# Patient Record
Sex: Female | Born: 1985 | Race: Black or African American | Hispanic: No | Marital: Single | Smoking: Former smoker
Health system: Southern US, Community
[De-identification: ages and names within clinical notes are randomized; demographics above are authoritative.]

## PROBLEM LIST (undated history)

## (undated) ENCOUNTER — Inpatient Hospital Stay (HOSPITAL_COMMUNITY): Payer: Self-pay

## (undated) DIAGNOSIS — Z3480 Encounter for supervision of other normal pregnancy, unspecified trimester: Secondary | ICD-10-CM

## (undated) DIAGNOSIS — Z789 Other specified health status: Secondary | ICD-10-CM

## (undated) DIAGNOSIS — R51 Headache: Secondary | ICD-10-CM

## (undated) DIAGNOSIS — Z3400 Encounter for supervision of normal first pregnancy, unspecified trimester: Secondary | ICD-10-CM

## (undated) HISTORY — PX: I & D EXTREMITY: SHX5045

## (undated) HISTORY — PX: NO PAST SURGERIES: SHX2092

## (undated) HISTORY — DX: Headache: R51

---
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
## 2007-10-24 ENCOUNTER — Emergency Department (HOSPITAL_COMMUNITY): Admission: EM | Admit: 2007-10-24 | Discharge: 2007-10-24 | Payer: Self-pay | Admitting: Emergency Medicine

## 2008-09-22 ENCOUNTER — Emergency Department (HOSPITAL_COMMUNITY): Admission: EM | Admit: 2008-09-22 | Discharge: 2008-09-22 | Payer: Self-pay | Admitting: Emergency Medicine

## 2009-01-26 ENCOUNTER — Emergency Department (HOSPITAL_COMMUNITY): Admission: EM | Admit: 2009-01-26 | Discharge: 2009-01-26 | Payer: Self-pay | Admitting: Emergency Medicine

## 2009-01-28 ENCOUNTER — Emergency Department (HOSPITAL_COMMUNITY): Admission: EM | Admit: 2009-01-28 | Discharge: 2009-01-29 | Payer: Self-pay | Admitting: Emergency Medicine

## 2010-03-23 LAB — CSF CELL COUNT WITH DIFFERENTIAL
RBC Count, CSF: 1 /mm3 — ABNORMAL HIGH
Tube #: 3

## 2010-03-23 LAB — CSF CULTURE W GRAM STAIN: Culture: NO GROWTH

## 2010-04-10 LAB — DIFFERENTIAL
Basophils Absolute: 0.1 10*3/uL (ref 0.0–0.1)
Eosinophils Absolute: 0 10*3/uL (ref 0.0–0.7)
Eosinophils Relative: 1 % (ref 0–5)
Lymphocytes Relative: 14 % (ref 12–46)
Monocytes Absolute: 0.6 10*3/uL (ref 0.1–1.0)

## 2010-04-10 LAB — URINALYSIS, ROUTINE W REFLEX MICROSCOPIC
Bilirubin Urine: NEGATIVE
Nitrite: NEGATIVE
Specific Gravity, Urine: 1.021 (ref 1.005–1.030)
pH: 6.5 (ref 5.0–8.0)

## 2010-04-10 LAB — CBC
Hemoglobin: 12.1 g/dL (ref 12.0–15.0)
MCHC: 33.1 g/dL (ref 30.0–36.0)
MCV: 87.2 fL (ref 78.0–100.0)
RBC: 4.18 MIL/uL (ref 3.87–5.11)

## 2010-04-10 LAB — POCT I-STAT, CHEM 8
Chloride: 105 mEq/L (ref 96–112)
Creatinine, Ser: 0.7 mg/dL (ref 0.4–1.2)
Hemoglobin: 13.6 g/dL (ref 12.0–15.0)
Potassium: 3.6 mEq/L (ref 3.5–5.1)
Sodium: 138 mEq/L (ref 135–145)

## 2010-04-10 LAB — RPR: RPR Ser Ql: NONREACTIVE

## 2010-04-10 LAB — URINE MICROSCOPIC-ADD ON

## 2010-05-25 ENCOUNTER — Inpatient Hospital Stay (HOSPITAL_COMMUNITY)
Admission: AD | Admit: 2010-05-25 | Discharge: 2010-05-25 | Disposition: A | Discharge status: Home / Self Care | Payer: BC Managed Care – PPO | Source: Ambulatory Visit | Attending: Obstetrics and Gynecology | Admitting: Obstetrics and Gynecology

## 2010-05-25 DIAGNOSIS — N949 Unspecified condition associated with female genital organs and menstrual cycle: Secondary | ICD-10-CM

## 2010-05-25 DIAGNOSIS — N938 Other specified abnormal uterine and vaginal bleeding: Secondary | ICD-10-CM | POA: Insufficient documentation

## 2010-05-25 LAB — WET PREP, GENITAL: Yeast Wet Prep HPF POC: NONE SEEN

## 2010-05-26 LAB — GC/CHLAMYDIA PROBE AMP, GENITAL: Chlamydia, DNA Probe: NEGATIVE

## 2010-07-15 ENCOUNTER — Emergency Department (HOSPITAL_COMMUNITY)
Admission: EM | Admit: 2010-07-15 | Discharge: 2010-07-16 | Disposition: A | Discharge status: Home / Self Care | Payer: BC Managed Care – PPO | Attending: Emergency Medicine | Admitting: Emergency Medicine

## 2010-07-15 DIAGNOSIS — R109 Unspecified abdominal pain: Secondary | ICD-10-CM | POA: Insufficient documentation

## 2010-07-15 DIAGNOSIS — O210 Mild hyperemesis gravidarum: Secondary | ICD-10-CM | POA: Insufficient documentation

## 2010-07-15 DIAGNOSIS — O99891 Other specified diseases and conditions complicating pregnancy: Secondary | ICD-10-CM | POA: Insufficient documentation

## 2010-07-16 ENCOUNTER — Emergency Department (HOSPITAL_COMMUNITY): Payer: BC Managed Care – PPO

## 2010-07-16 LAB — WET PREP, GENITAL
Trich, Wet Prep: NONE SEEN
Yeast Wet Prep HPF POC: NONE SEEN

## 2010-07-16 LAB — POCT I-STAT, CHEM 8
BUN: 12 mg/dL (ref 6–23)
Calcium, Ion: 1.2 mmol/L (ref 1.12–1.32)
Chloride: 103 mEq/L (ref 96–112)
Creatinine, Ser: 0.8 mg/dL (ref 0.50–1.10)
Glucose, Bld: 99 mg/dL (ref 70–99)
HCT: 41 % (ref 36.0–46.0)
Hemoglobin: 13.9 g/dL (ref 12.0–15.0)
Potassium: 4.1 mEq/L (ref 3.5–5.1)
Sodium: 140 mEq/L (ref 135–145)
TCO2: 25 mmol/L (ref 0–100)

## 2010-07-16 LAB — POCT PREGNANCY, URINE: Preg Test, Ur: POSITIVE

## 2010-07-16 LAB — HCG, QUANTITATIVE, PREGNANCY: hCG, Beta Chain, Quant, S: 41 m[IU]/mL — ABNORMAL HIGH (ref <5)

## 2010-07-16 LAB — URINALYSIS, ROUTINE W REFLEX MICROSCOPIC
Bilirubin Urine: NEGATIVE
Glucose, UA: NEGATIVE mg/dL
Hgb urine dipstick: NEGATIVE
Ketones, ur: NEGATIVE mg/dL
Leukocytes, UA: NEGATIVE
Nitrite: NEGATIVE
Protein, ur: NEGATIVE mg/dL
Specific Gravity, Urine: 1.026 (ref 1.005–1.030)
Urobilinogen, UA: 1 mg/dL (ref 0.0–1.0)
pH: 7 (ref 5.0–8.0)

## 2010-07-18 ENCOUNTER — Encounter (HOSPITAL_COMMUNITY): Payer: Self-pay

## 2010-07-18 ENCOUNTER — Inpatient Hospital Stay (HOSPITAL_COMMUNITY)
Admission: AD | Admit: 2010-07-18 | Discharge: 2010-07-18 | Disposition: A | Discharge status: Home / Self Care | Payer: BC Managed Care – PPO | Source: Ambulatory Visit | Attending: Family Medicine | Admitting: Family Medicine

## 2010-07-18 DIAGNOSIS — O2691 Pregnancy related conditions, unspecified, first trimester: Secondary | ICD-10-CM

## 2010-07-18 DIAGNOSIS — Z0189 Encounter for other specified special examinations: Secondary | ICD-10-CM

## 2010-07-18 DIAGNOSIS — O2690 Pregnancy related conditions, unspecified, unspecified trimester: Secondary | ICD-10-CM

## 2010-07-18 DIAGNOSIS — O209 Hemorrhage in early pregnancy, unspecified: Secondary | ICD-10-CM | POA: Insufficient documentation

## 2010-07-18 HISTORY — DX: Other specified health status: Z78.9

## 2010-07-18 NOTE — ED Provider Notes (Addendum)
Patient returns today for follow up Bhcg. She was evaluated on 7/14 and her Bhcg was 41, today it has increased to 152. She denies pain or bleeding. She will return in 2 days for follow up. Ectopic precautions given.   [place], [place] 07/18/10 1626

## 2010-07-18 NOTE — Progress Notes (Signed)
No bleeding no pain here for BHCG

## 2010-07-20 ENCOUNTER — Inpatient Hospital Stay (HOSPITAL_COMMUNITY)
Admission: AD | Admit: 2010-07-20 | Discharge: 2010-07-20 | Disposition: A | Discharge status: Home / Self Care | Payer: BC Managed Care – PPO | Source: Ambulatory Visit | Attending: Obstetrics & Gynecology | Admitting: Obstetrics & Gynecology

## 2010-07-20 ENCOUNTER — Inpatient Hospital Stay (HOSPITAL_COMMUNITY): Admit: 2010-07-20 | Payer: Self-pay

## 2010-07-20 ENCOUNTER — Encounter (HOSPITAL_COMMUNITY): Payer: Self-pay

## 2010-07-20 DIAGNOSIS — O3680X0 Pregnancy with inconclusive fetal viability, not applicable or unspecified: Secondary | ICD-10-CM

## 2010-07-20 DIAGNOSIS — O280 Abnormal hematological finding on antenatal screening of mother: Secondary | ICD-10-CM

## 2010-07-20 DIAGNOSIS — O209 Hemorrhage in early pregnancy, unspecified: Secondary | ICD-10-CM | POA: Insufficient documentation

## 2010-07-20 LAB — HCG, QUANTITATIVE, PREGNANCY: hCG, Beta Chain, Quant, S: 363 m[IU]/mL — ABNORMAL HIGH (ref <5)

## 2010-07-20 NOTE — ED Provider Notes (Addendum)
History     Chief Complaint  Patient presents with  . Follow-up   The history is provided by the patient.   Patient returns today for follow up Bhcg on her first visit 7/12 her Bhcg was 41 and her ultrasound showed no IUP.She returned on 7/14 and the numbers increased to 152.  She denies bleeding or pain today.  OB History    Grav Para Term Preterm Abortions TAB SAB Ect Mult Living   1               Past Medical History  Diagnosis Date  . No pertinent past medical history     No past surgical history on file.  No family history on file.  History  Substance Use Topics  . Smoking status: Not on file  . Smokeless tobacco: Not on file  . Alcohol Use:     Allergies: No Known Allergies  No prescriptions prior to admission    Review of Systems  Constitutional: Negative for fever and chills.  Genitourinary:       No bleeding or pain   Physical Exam   Blood pressure 128/72, pulse 100, temperature 98.7 F (37.1 C), temperature source Oral, resp. rate 16, height 5\' 7"  (1.702 m), weight 174 lb (78.926 kg), last menstrual period 05/23/2010, SpO2 99.00%.  Physical Exam  Nursing note and vitals reviewed. Constitutional: She appears well-developed and well-nourished.    MAU Course  Procedures  MDM Bhcg today is 363. Since pt. Has no pain or bleeding we will have her return in one week for f/u ultrasound. She will continue ectopic precautions and will return sooner for problems.

## 2010-07-20 NOTE — Progress Notes (Signed)
Pt to MAU for repeat BHCG. Pt denies pain or bleeding.

## 2010-07-27 ENCOUNTER — Encounter (HOSPITAL_COMMUNITY): Payer: Self-pay | Admitting: Physician Assistant

## 2010-07-27 ENCOUNTER — Other Ambulatory Visit (HOSPITAL_COMMUNITY): Payer: Self-pay | Admitting: Obstetrics & Gynecology

## 2010-07-27 ENCOUNTER — Ambulatory Visit (HOSPITAL_COMMUNITY)
Admit: 2010-07-27 | Discharge: 2010-07-27 | Disposition: A | Discharge status: Home / Self Care | Payer: BC Managed Care – PPO | Attending: Obstetrics & Gynecology | Admitting: Obstetrics & Gynecology

## 2010-07-27 ENCOUNTER — Inpatient Hospital Stay (HOSPITAL_COMMUNITY)
Admission: AD | Admit: 2010-07-27 | Discharge: 2010-07-27 | Disposition: A | Discharge status: Home / Self Care | Payer: Medicaid Other | Source: Ambulatory Visit | Attending: Obstetrics & Gynecology | Admitting: Obstetrics & Gynecology

## 2010-07-27 DIAGNOSIS — O3680X0 Pregnancy with inconclusive fetal viability, not applicable or unspecified: Secondary | ICD-10-CM

## 2010-07-27 DIAGNOSIS — Z3689 Encounter for other specified antenatal screening: Secondary | ICD-10-CM | POA: Insufficient documentation

## 2010-07-27 DIAGNOSIS — Z363 Encounter for antenatal screening for malformations: Secondary | ICD-10-CM | POA: Insufficient documentation

## 2010-07-27 DIAGNOSIS — Z1389 Encounter for screening for other disorder: Secondary | ICD-10-CM | POA: Insufficient documentation

## 2010-07-27 DIAGNOSIS — Z3201 Encounter for pregnancy test, result positive: Secondary | ICD-10-CM | POA: Insufficient documentation

## 2010-07-27 DIAGNOSIS — Z3490 Encounter for supervision of normal pregnancy, unspecified, unspecified trimester: Secondary | ICD-10-CM

## 2010-07-27 NOTE — Progress Notes (Signed)
Pt here for bhcg only, denies pain or bleeding.

## 2010-07-27 NOTE — ED Provider Notes (Signed)
History   Chief Complaint:  Labs Only   Dawn Velez is  25 y.o. G1P0 Patient's last menstrual period was 05/23/2010.Marland Kitchen  Her pregnancy status is positive.  She is [redacted]w[redacted]d by ultrasound today She presents for Labs Only . Pt presents for FU quant and ultrasound today. No complaints    Past Medical History  Diagnosis Date  . No pertinent past medical history     No past surgical history on file.  No family history on file.  History  Substance Use Topics  . Smoking status: Not on file  . Smokeless tobacco: Not on file  . Alcohol Use:     Allergies: No Known Allergies  No prescriptions prior to admission    Review of Systems - History obtained from the patient Negative  Physical Exam   Blood pressure 112/71, pulse 73, temperature 98.9 F (37.2 C), temperature source Oral, resp. rate 16, height 5\' 7"  (1.702 m), weight 174 lb (78.926 kg), last menstrual period 05/23/2010.  General: General appearance - alert, well appearing, and in no distress and oriented to person, place, and time Mental status - alert, oriented to person, place, and time, normal mood, behavior, speech, dress, motor activity, and thought processes, affect appropriate to mood Focused Gynecological Exam: examination not indicated  Labs: Recent Results (from the past 24 hour(s))  HCG, QUANTITATIVE, PREGNANCY   Collection Time   07/27/10 10:55 AM      Component Value Range   hCG, Beta Chain, Quant, S 4375 (*) <5 (mIU/mL)    Ultrasound Studies: IUP [redacted]w[redacted]d with + yolk sac   Assessment: Pregnancy IUP confirmed  Plan: FU with OB/Gyn provider of your choice to begin prenatal care  Discharge Medications: Rec: OTC PNV 1 po daily    Izaiyah Kleinman E. 07/27/2010, 12:13 PM

## 2010-09-01 LAB — HEPATITIS B SURFACE ANTIGEN: Hepatitis B Surface Ag: NEGATIVE

## 2010-09-01 LAB — RPR: RPR: NONREACTIVE

## 2010-09-01 LAB — HIV ANTIBODY (ROUTINE TESTING W REFLEX): HIV: NONREACTIVE

## 2010-10-06 ENCOUNTER — Inpatient Hospital Stay (HOSPITAL_COMMUNITY)
Admission: AD | Admit: 2010-10-06 | Discharge: 2010-10-06 | Disposition: A | Discharge status: Home / Self Care | Payer: BC Managed Care – PPO | Source: Ambulatory Visit | Attending: Obstetrics and Gynecology | Admitting: Obstetrics and Gynecology

## 2010-10-06 DIAGNOSIS — O99891 Other specified diseases and conditions complicating pregnancy: Secondary | ICD-10-CM | POA: Insufficient documentation

## 2010-10-06 DIAGNOSIS — Z331 Pregnant state, incidental: Secondary | ICD-10-CM

## 2010-10-06 DIAGNOSIS — G43909 Migraine, unspecified, not intractable, without status migrainosus: Secondary | ICD-10-CM | POA: Insufficient documentation

## 2010-10-06 LAB — URINE MICROSCOPIC-ADD ON

## 2010-10-06 LAB — URINALYSIS, ROUTINE W REFLEX MICROSCOPIC
Glucose, UA: NEGATIVE
Protein, ur: 100 — AB
Specific Gravity, Urine: 1.024
Urobilinogen, UA: 1

## 2010-10-06 MED ORDER — DIPHENHYDRAMINE HCL 50 MG/ML IJ SOLN
25.0000 mg | Freq: Once | INTRAMUSCULAR | Status: AC
  Administered 2010-10-06: 25 mg via INTRAVENOUS
  Filled 2010-10-06: qty 1

## 2010-10-06 MED ORDER — PROMETHAZINE HCL 25 MG/ML IJ SOLN
12.5000 mg | Freq: Once | INTRAVENOUS | Status: AC
  Administered 2010-10-06: 12.5 mg via INTRAVENOUS
  Filled 2010-10-06: qty 0.5

## 2010-10-06 MED ORDER — DEXAMETHASONE SODIUM PHOSPHATE 10 MG/ML IJ SOLN
10.0000 mg | Freq: Once | INTRAMUSCULAR | Status: AC
  Administered 2010-10-06: 10 mg via INTRAVENOUS
  Filled 2010-10-06: qty 1

## 2010-10-06 NOTE — Progress Notes (Signed)
"  I have had a H/A for 3 days now.  I called Dr. Ebony Hail office today and they scheduled me an appointment for tomorrow morning, but I couldn't wait that long.  Light & noise makes it worse."

## 2010-10-06 NOTE — Progress Notes (Signed)
Pt states she has had a headache for 3 days. Has taken tylenol without relief.

## 2010-10-06 NOTE — ED Provider Notes (Signed)
Chief Complaint:  Headache   Dawn Velez is  25 y.o. G1P0.  Patient's last menstrual period was 05/23/2010. [redacted]w[redacted]d    She presents complaining of Headache Chronic migraines, unable to take meds due to pregnancy. Onset is described as gradual and has been present for  3 days. + Nausea, phono/photo phobia  Obstetrical/Gynecological History: OB History    Grav Para Term Preterm Abortions TAB SAB Ect Mult Living   1               Past Medical History: Past Medical History  Diagnosis Date  . No pertinent past medical history     Past Surgical History: No past surgical history on file.  Family History: No family history on file.  Social History: History  Substance Use Topics  . Smoking status: Not on file  . Smokeless tobacco: Not on file  . Alcohol Use:     Allergies: No Known Allergies  Prescriptions prior to admission  Medication Sig Dispense Refill  . acetaminophen (TYLENOL) 500 MG tablet Take 1,000 mg by mouth every 6 (six) hours as needed.          Review of Systems - Negative except what has been reviewed in the HPI  Physical Exam   Blood pressure 134/79, pulse 78, temperature 98.5 F (36.9 C), temperature source Oral, resp. rate 16, last menstrual period 05/23/2010, SpO2 100.00%.  General: General appearance - oriented to person, place, and time and uncomfortable appearing, head under covers, lights in room off Mental status - normal mood, behavior, speech, dress, motor activity, and thought processes, affect appropriate to mood Eyes - pupils equal and reactive, extraocular eye movements intact, light sensitivity Nose - normal and patent, no erythema, discharge or polyps Mouth - mucous membranes moist, pharynx normal without lesions Neck - supple, no significant adenopathy Lymphatics - no palpable lymphadenopathy, no hepatosplenomegaly Neurological - screening mental status exam normal, neck supple without rigidity, cranial nerves II through XII  intact, DTR's normal and symmetric, motor and sensory grossly normal bilaterally Musculoskeletal - no joint tenderness, deformity or swelling, no muscular tenderness noted Focused Gynecological Exam: examination not indicated FHR: 148  MD Consult: Discussed with Dr. Ambrose Mantle, agrees with plan for IVF, Dexamethasone, benadryl, and Phenergan IV  ED Course: IVF and meds per HA protocol Pt rating pain 0/10 after meds, sitting in room with lights on eating pizza  Assessment: Migraine HA Pregnancy  Plan: Discharge home FU as scheduled in the office tomorrow  Sophie Quiles E. 10/06/2010,8:02 PM

## 2011-01-05 NOTE — L&D Delivery Note (Signed)
Delivery Note At 5:25 PM a viable female was delivered via Vaginal, Spontaneous Delivery (Presentation: Right Occiput Anterior).  APGAR: 8, 9; weight 7 lb 13.4 oz (3555 g).   Placenta status: Intact, Spontaneous.  Cord: 3 vessels with the following complications: None.   Anesthesia: Epidural  Episiotomy: N/a\a Lacerations: 2nd degree;Perineal Suture Repair: 3.0 vicryl rapide Est. Blood Loss (mL): 500 Mom to postpartum.  Baby to nursery-stable.  BOVARD,Peretz Thieme 03/30/2011, 5:51 PM  B+/RI/ Br/POPs

## 2011-02-23 LAB — STREP B DNA PROBE: GBS: POSITIVE

## 2011-03-28 ENCOUNTER — Encounter (HOSPITAL_COMMUNITY): Payer: Self-pay

## 2011-03-28 ENCOUNTER — Inpatient Hospital Stay (HOSPITAL_COMMUNITY)
Admission: AD | Admit: 2011-03-28 | Discharge: 2011-03-28 | Disposition: A | Discharge status: Home / Self Care | Payer: BC Managed Care – PPO | Source: Ambulatory Visit | Attending: Obstetrics and Gynecology | Admitting: Obstetrics and Gynecology

## 2011-03-28 DIAGNOSIS — O479 False labor, unspecified: Secondary | ICD-10-CM | POA: Insufficient documentation

## 2011-03-28 MED ORDER — MORPHINE SULFATE 10 MG/ML IJ SOLN
5.0000 mg | Freq: Once | INTRAMUSCULAR | Status: AC
  Administered 2011-03-28: 5 mg via INTRAMUSCULAR
  Filled 2011-03-28: qty 1

## 2011-03-28 NOTE — MAU Note (Signed)
Contractions since around 4:00 with some vaginal bleeding, contractions every 5 to 7 minutes, 40 minutes.

## 2011-03-28 NOTE — Discharge Instructions (Signed)

## 2011-03-29 ENCOUNTER — Inpatient Hospital Stay (HOSPITAL_COMMUNITY)
Admission: AD | Admit: 2011-03-29 | Discharge: 2011-04-01 | DRG: 373 | Disposition: A | Discharge status: Home / Self Care | Payer: BC Managed Care – PPO | Attending: Obstetrics and Gynecology | Admitting: Obstetrics and Gynecology

## 2011-03-29 ENCOUNTER — Encounter (HOSPITAL_COMMUNITY): Payer: Self-pay | Admitting: *Deleted

## 2011-03-29 ENCOUNTER — Encounter (HOSPITAL_COMMUNITY): Payer: Self-pay

## 2011-03-29 ENCOUNTER — Telehealth (HOSPITAL_COMMUNITY): Payer: Self-pay | Admitting: *Deleted

## 2011-03-29 DIAGNOSIS — O480 Post-term pregnancy: Principal | ICD-10-CM | POA: Diagnosis present

## 2011-03-29 DIAGNOSIS — Z3400 Encounter for supervision of normal first pregnancy, unspecified trimester: Secondary | ICD-10-CM

## 2011-03-29 HISTORY — DX: Encounter for supervision of normal first pregnancy, unspecified trimester: Z34.00

## 2011-03-29 NOTE — Telephone Encounter (Signed)
Preadmission screen  

## 2011-03-29 NOTE — MAU Note (Signed)
Pt presents to mau for labor check.  Was 1cm yesterday.

## 2011-03-29 NOTE — H&P (Signed)
Dawn Velez is a 26 y.o. female G1P0 at 49+ for iol.  Pt has had irregular contractions for several days.  Relatively uncomlicated PNC, +FM, no LOF, no VB, occ ctx;   Maternal Medical History:  Contractions: Onset was more than 2 days ago.   Frequency: irregular.    Fetal activity: Perceived fetal activity is normal.      OB History    Grav Para Term Preterm Abortions TAB SAB Ect Mult Living   1 0 0 0 0 0 0 0 0 0     G1 present; no abn pap, no STDs  Past Medical History  Diagnosis Date  . No pertinent past medical history   . Headache   . Normal pregnancy, first 03/29/2011   Past Surgical History  Procedure Date  . No past surgeries   . I&d extremity     boil on back   Family History: family history includes Alcohol abuse in her paternal uncle; Drug abuse in her paternal grandfather; Heart disease in her paternal grandmother; Hypertension in her mother, paternal grandfather, and sister; Mental illness in her brother; and Mental retardation in her brother.  There is no history of Anesthesia problems. Social History:  does not have a smoking history on file. She does not have any smokeless tobacco history on file. She reports that she does not drink alcohol or use illicit drugs.single Meds PNV All NKDA  Review of Systems  Constitutional: Negative.   HENT: Negative.   Eyes: Negative.   Respiratory: Negative.   Cardiovascular: Negative.   Gastrointestinal: Negative.   Genitourinary: Negative.   Musculoskeletal: Negative.   Skin: Negative.   Neurological: Negative.   Psychiatric/Behavioral: Negative.       Last menstrual period 05/23/2010. Maternal Exam:  Abdomen: Fundal height is appropriate for gestation.   Estimated fetal weight is 7#.   Fetal presentation: vertex     Physical Exam  Constitutional: She is oriented to person, place, and time. She appears well-developed and well-nourished.  HENT:  Head: Normocephalic.  Neck: Normal range of motion. Neck  supple. No thyromegaly present.  Cardiovascular: Normal rate and regular rhythm.   Respiratory: Effort normal and breath sounds normal. No respiratory distress.  GI: Soft. Bowel sounds are normal. There is no tenderness.  Musculoskeletal: Normal range of motion.  Neurological: She is alert and oriented to person, place, and time.  Skin: Skin is warm.  Psychiatric: She has a normal mood and affect. Her behavior is normal.    Prenatal labs: ABO, Rh: B/Positive/-- (08/28 0000) Antibody: Negative (08/28 0000) Rubella: Immune (08/28 0000) RPR: Nonreactive (08/28 0000)  HBsAg: Negative (08/28 0000)  HIV: Non-reactive (08/28 0000)  GBS: Positive (02/19 0000)  Hgb 13.1/ Hgb electro WNL/Plt 295K/ GC neg/ Chl neg/ First Tri Screen WNL/ AFP WNL/ CF neg  [place] 9 wk cwd First Tri Screen WNL Anat scan - Lt CP cyst, ow nl anat, ant plac, female Assessment/Plan: 25yo G1P0 at 34+ for iol Given PCN for gbbs prophylaxis Pitocin and AROM to augment Epidural for comfort Expect SVD   BOVARD,Dawn Velez 03/29/2011, 10:59 PM

## 2011-03-29 NOTE — Progress Notes (Signed)
Dr. Ambrose Mantle notified of pt presenting for labor check. Notified of VE and ctx pattern. Notified of induction in am.  Orders to monitor for one hour and recheck cervix.

## 2011-03-30 ENCOUNTER — Inpatient Hospital Stay (HOSPITAL_COMMUNITY)
Admission: RE | Admit: 2011-03-30 | Discharge: 2011-03-30 | DRG: 382 | Disposition: A | Discharge status: Home / Self Care | Payer: BC Managed Care – PPO | Source: Ambulatory Visit | Attending: Obstetrics and Gynecology | Admitting: Obstetrics and Gynecology

## 2011-03-30 ENCOUNTER — Encounter (HOSPITAL_COMMUNITY): Payer: Self-pay | Admitting: Anesthesiology

## 2011-03-30 ENCOUNTER — Inpatient Hospital Stay (HOSPITAL_COMMUNITY): Payer: BC Managed Care – PPO | Admitting: Anesthesiology

## 2011-03-30 ENCOUNTER — Encounter (HOSPITAL_COMMUNITY): Payer: Self-pay | Admitting: *Deleted

## 2011-03-30 ENCOUNTER — Encounter (HOSPITAL_COMMUNITY): Payer: Self-pay | Admitting: Obstetrics and Gynecology

## 2011-03-30 DIAGNOSIS — O479 False labor, unspecified: Secondary | ICD-10-CM | POA: Diagnosis present

## 2011-03-30 DIAGNOSIS — Z3400 Encounter for supervision of normal first pregnancy, unspecified trimester: Secondary | ICD-10-CM

## 2011-03-30 HISTORY — DX: Encounter for supervision of normal first pregnancy, unspecified trimester: Z34.00

## 2011-03-30 LAB — CBC
Hemoglobin: 12.7 g/dL (ref 12.0–15.0)
MCH: 28.2 pg (ref 26.0–34.0)
RBC: 4.5 MIL/uL (ref 3.87–5.11)

## 2011-03-30 LAB — RPR: RPR Ser Ql: NONREACTIVE

## 2011-03-30 MED ORDER — ONDANSETRON HCL 4 MG/2ML IJ SOLN: 4.0000 mg | INTRAMUSCULAR | Status: DC | PRN

## 2011-03-30 MED ORDER — PENICILLIN G POTASSIUM 5000000 UNITS IJ SOLR
2.5000 10*6.[IU] | INTRAVENOUS | Status: DC
  Administered 2011-03-30 (×3): 2.5 10*6.[IU] via INTRAVENOUS
  Filled 2011-03-30 (×7): qty 2.5

## 2011-03-30 MED ORDER — SIMETHICONE 80 MG PO CHEW: 80.0000 mg | CHEWABLE_TABLET | ORAL | Status: DC | PRN

## 2011-03-30 MED ORDER — LACTATED RINGERS IV SOLN: 500.0000 mL | INTRAVENOUS | Status: DC | PRN

## 2011-03-30 MED ORDER — BENZOCAINE-MENTHOL 20-0.5 % EX AERO
1.0000 "application " | INHALATION_SPRAY | CUTANEOUS | Status: DC | PRN
  Administered 2011-03-30: 1 via TOPICAL

## 2011-03-30 MED ORDER — ONDANSETRON HCL 4 MG/2ML IJ SOLN: 4.0000 mg | Freq: Four times a day (QID) | INTRAMUSCULAR | Status: DC | PRN

## 2011-03-30 MED ORDER — FENTANYL 2.5 MCG/ML BUPIVACAINE 1/10 % EPIDURAL INFUSION (WH - ANES)
INTRAMUSCULAR | Status: DC | PRN
  Administered 2011-03-30: 15 mL/h via EPIDURAL

## 2011-03-30 MED ORDER — CITRIC ACID-SODIUM CITRATE 334-500 MG/5ML PO SOLN: 30.0000 mL | ORAL | Status: DC | PRN

## 2011-03-30 MED ORDER — LACTATED RINGERS IV SOLN
INTRAVENOUS | Status: DC
  Administered 2011-03-30 (×6): via INTRAVENOUS

## 2011-03-30 MED ORDER — SENNOSIDES-DOCUSATE SODIUM 8.6-50 MG PO TABS
2.0000 | ORAL_TABLET | Freq: Every day | ORAL | Status: DC
  Administered 2011-03-30 – 2011-03-31 (×2): 2 via ORAL

## 2011-03-30 MED ORDER — BUTORPHANOL TARTRATE 2 MG/ML IJ SOLN
1.0000 mg | Freq: Once | INTRAMUSCULAR | Status: AC
  Administered 2011-03-30: 1 mg via INTRAVENOUS
  Filled 2011-03-30: qty 1

## 2011-03-30 MED ORDER — OXYTOCIN 20 UNITS IN LACTATED RINGERS INFUSION - SIMPLE: 125.0000 mL/h | Freq: Once | INTRAVENOUS | Status: DC

## 2011-03-30 MED ORDER — PRENATAL MULTIVITAMIN CH
1.0000 | ORAL_TABLET | Freq: Every day | ORAL | Status: DC
  Administered 2011-03-31 – 2011-04-01 (×2): 1 via ORAL
  Filled 2011-03-30 (×3): qty 1

## 2011-03-30 MED ORDER — PHENYLEPHRINE 40 MCG/ML (10ML) SYRINGE FOR IV PUSH (FOR BLOOD PRESSURE SUPPORT)
80.0000 ug | PREFILLED_SYRINGE | INTRAVENOUS | Status: DC | PRN
  Filled 2011-03-30 (×2): qty 5

## 2011-03-30 MED ORDER — OXYCODONE-ACETAMINOPHEN 5-325 MG PO TABS: 1.0000 | ORAL_TABLET | ORAL | Status: DC | PRN

## 2011-03-30 MED ORDER — TERBUTALINE SULFATE 1 MG/ML IJ SOLN: 0.2500 mg | Freq: Once | INTRAMUSCULAR | Status: DC | PRN

## 2011-03-30 MED ORDER — ZOLPIDEM TARTRATE 5 MG PO TABS: 5.0000 mg | ORAL_TABLET | Freq: Every evening | ORAL | Status: DC | PRN

## 2011-03-30 MED ORDER — LACTATED RINGERS IV SOLN: 500.0000 mL | Freq: Once | INTRAVENOUS | Status: DC

## 2011-03-30 MED ORDER — IBUPROFEN 600 MG PO TABS
600.0000 mg | ORAL_TABLET | Freq: Four times a day (QID) | ORAL | Status: DC
  Administered 2011-03-31 – 2011-04-01 (×7): 600 mg via ORAL
  Filled 2011-03-30 (×7): qty 1

## 2011-03-30 MED ORDER — OXYTOCIN 20 UNITS IN LACTATED RINGERS INFUSION - SIMPLE
1.0000 m[IU]/min | INTRAVENOUS | Status: DC
  Administered 2011-03-30: 7 m[IU]/min via INTRAVENOUS
  Administered 2011-03-30: 1 m[IU]/min via INTRAVENOUS
  Administered 2011-03-30: 333 m[IU]/min via INTRAVENOUS
  Filled 2011-03-30 (×2): qty 1000

## 2011-03-30 MED ORDER — PENICILLIN G POTASSIUM 5000000 UNITS IJ SOLR
5.0000 10*6.[IU] | Freq: Once | INTRAVENOUS | Status: AC
  Administered 2011-03-30: 5 10*6.[IU] via INTRAVENOUS
  Filled 2011-03-30: qty 5

## 2011-03-30 MED ORDER — LIDOCAINE HCL (PF) 1 % IJ SOLN: 30.0000 mL | INTRAMUSCULAR | Status: DC | PRN

## 2011-03-30 MED ORDER — FLEET ENEMA 7-19 GM/118ML RE ENEM: 1.0000 | ENEMA | RECTAL | Status: DC | PRN

## 2011-03-30 MED ORDER — DIPHENHYDRAMINE HCL 25 MG PO CAPS: 25.0000 mg | ORAL_CAPSULE | Freq: Four times a day (QID) | ORAL | Status: DC | PRN

## 2011-03-30 MED ORDER — PRENATAL MULTIVITAMIN CH: 1.0000 | ORAL_TABLET | Freq: Every day | ORAL | Status: DC

## 2011-03-30 MED ORDER — DIPHENHYDRAMINE HCL 50 MG/ML IJ SOLN: 12.5000 mg | INTRAMUSCULAR | Status: DC | PRN

## 2011-03-30 MED ORDER — EPHEDRINE 5 MG/ML INJ
10.0000 mg | INTRAVENOUS | Status: DC | PRN
  Administered 2011-03-30: 10 mg via INTRAVENOUS

## 2011-03-30 MED ORDER — OXYTOCIN BOLUS FROM INFUSION
500.0000 mL | Freq: Once | INTRAVENOUS | Status: DC
Start: 2011-03-30 — End: 2011-03-30
  Filled 2011-03-30: qty 500

## 2011-03-30 MED ORDER — ACETAMINOPHEN 325 MG PO TABS: 650.0000 mg | ORAL_TABLET | ORAL | Status: DC | PRN

## 2011-03-30 MED ORDER — DIBUCAINE 1 % RE OINT: 1.0000 "application " | TOPICAL_OINTMENT | RECTAL | Status: DC | PRN

## 2011-03-30 MED ORDER — FENTANYL 2.5 MCG/ML BUPIVACAINE 1/10 % EPIDURAL INFUSION (WH - ANES)
14.0000 mL/h | INTRAMUSCULAR | Status: DC
  Administered 2011-03-30 (×2): 14 mL/h via EPIDURAL
  Filled 2011-03-30 (×3): qty 60

## 2011-03-30 MED ORDER — TETANUS-DIPHTH-ACELL PERTUSSIS 5-2.5-18.5 LF-MCG/0.5 IM SUSP
0.5000 mL | Freq: Once | INTRAMUSCULAR | Status: AC
  Administered 2011-03-31: 0.5 mL via INTRAMUSCULAR
  Filled 2011-03-30: qty 0.5

## 2011-03-30 MED ORDER — BUTORPHANOL TARTRATE 2 MG/ML IJ SOLN
2.0000 mg | INTRAMUSCULAR | Status: DC | PRN
  Administered 2011-03-30: 2 mg via INTRAVENOUS
  Filled 2011-03-30: qty 1

## 2011-03-30 MED ORDER — LIDOCAINE HCL (PF) 1 % IJ SOLN
INTRAMUSCULAR | Status: DC | PRN
  Administered 2011-03-30: 5 mL

## 2011-03-30 MED ORDER — BENZOCAINE-MENTHOL 20-0.5 % EX AERO
INHALATION_SPRAY | CUTANEOUS | Status: AC
  Filled 2011-03-30: qty 56

## 2011-03-30 MED ORDER — LACTATED RINGERS IV SOLN: INTRAVENOUS | Status: DC

## 2011-03-30 MED ORDER — SODIUM BICARBONATE 8.4 % IV SOLN
INTRAVENOUS | Status: DC | PRN
  Administered 2011-03-30: 4 mL via EPIDURAL

## 2011-03-30 MED ORDER — PHENYLEPHRINE 40 MCG/ML (10ML) SYRINGE FOR IV PUSH (FOR BLOOD PRESSURE SUPPORT): 80.0000 ug | PREFILLED_SYRINGE | INTRAVENOUS | Status: DC | PRN

## 2011-03-30 MED ORDER — WITCH HAZEL-GLYCERIN EX PADS: 1.0000 "application " | MEDICATED_PAD | CUTANEOUS | Status: DC | PRN

## 2011-03-30 MED ORDER — EPHEDRINE 5 MG/ML INJ
10.0000 mg | INTRAVENOUS | Status: DC | PRN
  Filled 2011-03-30 (×2): qty 4

## 2011-03-30 MED ORDER — ONDANSETRON HCL 4 MG PO TABS: 4.0000 mg | ORAL_TABLET | ORAL | Status: DC | PRN

## 2011-03-30 MED ORDER — LANOLIN HYDROUS EX OINT: TOPICAL_OINTMENT | CUTANEOUS | Status: DC | PRN

## 2011-03-30 MED ORDER — IBUPROFEN 600 MG PO TABS: 600.0000 mg | ORAL_TABLET | Freq: Four times a day (QID) | ORAL | Status: DC | PRN

## 2011-03-30 NOTE — Progress Notes (Signed)
Dawn Velez is a 26 y.o. G1P0000 at [redacted]w[redacted]d Admitted for induction of labor due to Post dates.  cam in overnight for ctx.    Due date 3/23.  Subjective: GETTING COMFORATABLE WITH EPIDURAL  Objective: BP 105/55  Pulse 83  Temp(Src) 98.2 F (36.8 C) (Oral)  Resp 20  Ht 5\' 7"  (1.702 m)  Wt 99.338 kg (219 lb)  BMI 34.30 kg/m2  SpO2 98%  LMP 05/23/2010      FHT:  FHR: 130's bpm, variability: moderate,  accelerations:  Present,  decelerations:  Absent UC:   regular, every 1-3 minutes SVE:   Dilation: 2 Effacement (%): 50 Station: -1 Exam by:: Dr.Henley  Labs: Lab Results  Component Value Date   WBC 9.1 03/30/2011   HGB 12.7 03/30/2011   HCT 38.6 03/30/2011   MCV 85.8 03/30/2011   PLT 277 03/30/2011    Assessment / Plan: Spontaneous labor, progressing normally  Labor: Progressing on Pitocin, will continue to increase then AROM after PCN Preeclampsia:  no signs or symptoms of toxicity Fetal Wellbeing:  Category I Pain Control:  Epidural I/D:  n/a Anticipated MOD:  NSVD  BOVARD,Dessie Tatem 03/30/2011, 9:24 AM

## 2011-03-30 NOTE — Anesthesia Preprocedure Evaluation (Addendum)
Anesthesia Evaluation  Patient identified by MRN, date of birth, ID band Patient awake    Reviewed: Allergy & Precautions, H&P , Patient's Chart, lab work & pertinent test results  Airway Mallampati: III TM Distance: >3 FB Neck ROM: full    Dental No notable dental hx. (+) Teeth Intact   Pulmonary neg pulmonary ROS,  breath sounds clear to auscultation  Pulmonary exam normal       Cardiovascular negative cardio ROS  Rhythm:regular Rate:Normal     Neuro/Psych  Headaches, negative psych ROS   GI/Hepatic negative GI ROS, Neg liver ROS,   Endo/Other  negative endocrine ROS  Renal/GU negative Renal ROS  negative genitourinary   Musculoskeletal   Abdominal Normal abdominal exam  (+)   Peds  Hematology negative hematology ROS (+)   Anesthesia Other Findings Pierced tongue and nose   Reproductive/Obstetrics (+) Pregnancy                          Anesthesia Physical Anesthesia Plan  ASA: II  Anesthesia Plan: Epidural   Post-op Pain Management:    Induction:   Airway Management Planned:   Additional Equipment:   Intra-op Plan:   Post-operative Plan:   Informed Consent: I have reviewed the patients History and Physical, chart, labs and discussed the procedure including the risks, benefits and alternatives for the proposed anesthesia with the patient or authorized representative who has indicated his/her understanding and acceptance.     Plan Discussed with: Anesthesiologist  Anesthesia Plan Comments:         Anesthesia Quick Evaluation

## 2011-03-30 NOTE — Progress Notes (Signed)
Patient ID: Dawn Velez, female   DOB: [birth year], 26 y.o.   MRN: [id number] Pt was scheduled for induction today but came here contracting. She was having contractions q 5 minutes and received stadol and the contractions have spaced out to q 5-10 minutes. The FHT's are normal The cervix is 2 cm 50 % effaced,and the vertex is at -1 station. The cervix is anterior. She declines an epidural now.I will start pitocin to try to get a labor pattern started.

## 2011-03-30 NOTE — Progress Notes (Signed)
Dawn Velez is a 26 y.o. G1P0000 at [redacted]w[redacted]d} admitted for induction of labor due to Post dates. Due date 3/23.  Subjective: Getting comf with epidural,  pressure  Objective: BP 99/45  Pulse 73  Temp(Src) 97.8 F (36.6 C) (Oral)  Resp 16  Ht 5\' 7"  (1.702 m)  Wt 99.338 kg (219 lb)  BMI 34.30 kg/m2  SpO2 99%  LMP 05/23/2010      FHT:  FHR: 130 bpm, variability: moderate,  accelerations:  Present,  decelerations:  Absent UC:   regular, every 2-4 minutes SVE:   Dilation:5 Effacement (%): 90 Station: 0 Bovard, MD  Labs: Lab Results  Component Value Date   WBC 9.1 03/30/2011   HGB 12.7 03/30/2011   HCT 38.6 03/30/2011   MCV 85.8 03/30/2011   PLT 277 03/30/2011    Assessment / Plan: Induction of labor due to term with favorable cervix,  progressing well on pitocin  Labor: Progressing normally Preeclampsia:  no signs or symptoms of toxicity Fetal Wellbeing:  Category I Pain Control:  Epidural I/D:  n/a Anticipated MOD:  NSVD  BOVARD,Poseidon Pam 03/30/2011, 12:47 PM

## 2011-03-30 NOTE — Progress Notes (Signed)
Dr. Ambrose Mantle notified of minor cervical exam change.  Admit orders received.

## 2011-03-30 NOTE — Progress Notes (Signed)
Pt may go to room 167. 

## 2011-03-30 NOTE — Anesthesia Procedure Notes (Signed)
Epidural Patient location during procedure: OB Start time: 03/30/2011 9:11 AM  Staffing Anesthesiologist: Willard Farquharson A. Performed by: anesthesiologist   Preanesthetic Checklist Completed: patient identified, site marked, surgical consent, pre-op evaluation, timeout performed, IV checked, risks and benefits discussed and monitors and equipment checked  Epidural Patient position: sitting Prep: site prepped and draped and DuraPrep Patient monitoring: continuous pulse ox and blood pressure Approach: midline Injection technique: LOR air  Needle:  Needle type: Tuohy  Needle gauge: 17 G Needle length: 9 cm Needle insertion depth: 8 cm Catheter type: closed end flexible Catheter size: 19 Gauge Catheter at skin depth: 13 cm Test dose: negative and Other  Assessment Events: blood not aspirated, injection not painful, no injection resistance, negative IV test and no paresthesia  Additional Notes Patient identified. Risks and benefits discussed including failed block, incomplete  Pain control, post dural puncture headache, nerve damage, paralysis, blood pressure Changes, nausea, vomiting, reactions to medications-both toxic and allergic and post Partum back pain. All questions were answered. Patient expressed understanding and wished to proceed. Sterile technique was used throughout procedure. Epidural site was Dressed with sterile barrier dressing. No paresthesias, signs of intravascular injection Or signs of intrathecal spread were encountered.  Patient was more comfortable after the epidural was dosed. Please see RN's note for documentation of vital signs and FHR which are stable.

## 2011-03-31 ENCOUNTER — Encounter (HOSPITAL_COMMUNITY): Payer: Self-pay | Admitting: *Deleted

## 2011-03-31 LAB — CBC
MCH: 28 pg (ref 26.0–34.0)
MCV: 85.9 fL (ref 78.0–100.0)
Platelets: 220 10*3/uL (ref 150–400)
RDW: 14.3 % (ref 11.5–15.5)
WBC: 12.3 10*3/uL — ABNORMAL HIGH (ref 4.0–10.5)

## 2011-03-31 NOTE — Anesthesia Postprocedure Evaluation (Signed)
  Anesthesia Post-op Note  Patient: Dawn Velez  Procedure(s) Performed: * No procedures listed *  Patient Location: Mother/Baby  Anesthesia Type: Epidural  Level of Consciousness: awake, alert  and oriented  Airway and Oxygen Therapy: Patient Spontanous Breathing  Post-op Pain: none  Post-op Assessment: Post-op Vital signs reviewed, Patient's Cardiovascular Status Stable, No headache, No backache, No residual numbness and No residual motor weakness  Post-op Vital Signs: Reviewed and stable  Complications: No apparent anesthesia complications

## 2011-03-31 NOTE — Progress Notes (Signed)
Post Partum Day 1 Subjective: no complaints, up ad lib, tolerating PO and nl lochia, pain controlled  Objective: Blood pressure 100/65, pulse 81, temperature 97.6 F (36.4 C), temperature source Oral, resp. rate 18, height 5\' 7"  (1.702 m), weight 99.338 kg (219 lb), last menstrual period 05/23/2010, SpO2 99.00%, unknown if currently breastfeeding.  Physical Exam:  General: alert and no distress Lochia: appropriate Uterine Fundus: firm   Basename 03/31/11 0623 03/30/11 0110  HGB 11.7* 12.7  HCT 35.9* 38.6    Assessment/Plan: Plan for discharge tomorrow, Breastfeeding and Lactation consult   LOS: 2 days   BOVARD,Aryel Edelen 03/31/2011, 7:50 AM

## 2011-04-01 MED ORDER — IBUPROFEN 800 MG PO TABS: 800.0000 mg | ORAL_TABLET | Freq: Three times a day (TID) | ORAL | Status: AC | PRN

## 2011-04-01 MED ORDER — OXYCODONE-ACETAMINOPHEN 5-325 MG PO TABS: 1.0000 | ORAL_TABLET | Freq: Four times a day (QID) | ORAL | Status: AC | PRN

## 2011-04-01 MED ORDER — PRENATAL MULTIVITAMIN CH: 1.0000 | ORAL_TABLET | Freq: Every day | ORAL | Status: DC

## 2011-04-01 NOTE — Progress Notes (Signed)
Post Partum Day 2 Subjective: no complaints, voiding, tolerating PO and nl lochia, pain controlled  Objective: Blood pressure 114/69, pulse 88, temperature 98.1 F (36.7 C), temperature source Oral, resp. rate 18, height 5\' 7"  (1.702 m), weight 99.338 kg (219 lb), last menstrual period 05/23/2010, SpO2 99.00%, unknown if currently breastfeeding.  Physical Exam:  General: alert and no distress Lochia: appropriate Uterine Fundus: firm   Basename 03/31/11 0623 03/30/11 0110  HGB 11.7* 12.7  HCT 35.9* 38.6    Assessment/Plan: Discharge home, Breastfeeding and Lactation consult  D/c home with motrin/percocet/ pnv, f/y 6 wks or prn   LOS: 3 days   BOVARD,Seydou Hearns 04/01/2011, 8:55 AM

## 2011-04-01 NOTE — Discharge Summary (Signed)
Obstetric Discharge Summary Reason for Admission: induction of labor Prenatal Procedures: none Intrapartum Procedures: spontaneous vaginal delivery Postpartum Procedures: none Complications-Operative and Postpartum: 2nd degree perineal laceration Hemoglobin  Date Value Range Status  03/31/2011 11.7* 12.0-15.0 (g/dL) Final     HCT  Date Value Range Status  03/31/2011 35.9* 36.0-46.0 (%) Final    Physical Exam:  General: alert and no distress Lochia: appropriate Uterine Fundus: firm  Discharge Diagnoses: Term Pregnancy-delivered  Discharge Information: Date: 04/01/2011 Activity: pelvic rest Diet: routine Medications: PNV, Ibuprofen and Percocet Condition: stable Instructions: refer to practice specific booklet Discharge to: home Follow-up Information    Follow up with BOVARD,Panayiota Larkin, MD. Schedule an appointment as soon as possible for a visit in 6 weeks.   Contact information:   [address] [facility] [address] [place] [address] [phone]          Newborn Data: Live born female  Birth Weight: 7 lb 13.4 oz (3555 g) APGAR: 8, 9  Home with mother.  BOVARD,Halie Gass 04/01/2011, 9:00 AM

## 2011-04-05 ENCOUNTER — Ambulatory Visit (HOSPITAL_COMMUNITY)
Admit: 2011-04-05 | Discharge: 2011-04-05 | Disposition: A | Discharge status: Home / Self Care | Payer: BC Managed Care – PPO | Attending: Obstetrics and Gynecology | Admitting: Obstetrics and Gynecology

## 2011-04-05 NOTE — Progress Notes (Addendum)
Adult Lactation Consultation Outpatient Visit Note  Patient Name: Dawn Velez Date of Birth: 16-May-1985 Gestational Age at Delivery: [redacted]w[redacted]d Type of Delivery: Spontaneous Vaginal  Breastfeeding History: Frequency of Breastfeeding: 2-3 hours Length of Feeding: 15 minutes on one side  Voids: 8 Stools: 2  Supplementing / Method: non Pumping:  Type of Pump: Hand pump   Frequency: as needed to relieve pressure  Volume:  240 ml (4 oz)  Comments: Discharge weight - 7 lbs, 7.1 oz = 3374 grams Today's weight ac - 7 lbs, 11.6 oz = 3506 grams  Infant has gained 4.5 oz in 4 days (d/c on Thursday, April 01, 2011)  Consultation Evaluation:  Initial Feeding Assessment: Pre-feed Weight:7 lbs, 11.6 oz = 3506 grams Post-feed Weight:7 lbs, 13.8 = 3566 grams Amount Transferred:60 mls Comments: Left breast (first side).  Infant awoke with undressing.  Mom latched using a cross-cradle hold; after latching switched to a cradle hold.  Infant opened with wide open mouth, flanged lips, and began sucking.  Mom reports feeling let-down.  Frequent audible swallows heard.  Latch Score - 10.  Infant fed for 15 minutes.  Peds wanted mom to come in for a weight check due to first visit >2-4 days after discharge.  First visit with peds scheduled for tomorrow, Tuesday, April 2 at 2:15pm.  Encouraged to offer second side and infant latched.  See below for notes about second feeding.  Additional Feeding Assessment: Pre-feed Weight: 3566 grams Post-feed Weight: 3600 grams Amount Transferred: 34 mls Comments: Right breast (second side).  Mom latched infant without difficulty.  Latch Score 10 on second side.  Infant fed for 15 minutes and came off the breast independently.     Total Breast milk Transferred this Visit: 94 mls Total Supplement Given: none  Additional Interventions: Encouraged to continue feeding on demand with cues and offering second side with each feeding.  Explained to mom she cannot  overfeed a breastfed baby.  Explained the baby may or may not eat from second side with each feeding. Educated on cluster feedings with growth spurts and discussed ages for growth spurts.  Mom reports noticing infant feeding more frequently today (day 7 of life).  Discussed ways to maintain milk supply when introducing bottles of previously pumped milk.  Complimented mom on great job with feedings and maintaining a good milk supply.   Follow-Up With Pediatrician tomorrow at 2:15p per mom for first Dr. Visit at [facility]     Lendon Ka 04/05/2011, 2:53 PM

## 2012-01-05 NOTE — L&D Delivery Note (Signed)
Delivery Note Pt progressed rapidly to C/C/+2-3.  At 8:14 AM a viable and healthy female was delivered via Vaginal, Spontaneous Delivery (Presentation: Left Occiput Anterior).  APGAR: 9, 9; weight .   Placenta status: Intact, Spontaneous.  Cord: 3 vessels with the following complications: Body nuchal.    Anesthesia: Epidural  Episiotomy: none Lacerations: vaginal laceration Suture Repair: N/A Est. Blood Loss (mL): 400  Mom to postpartum.  Baby to stay with mom and dad skin to skin.  BOVARD,Matti Minney 10/28/2012, 8:40 AM  Br/B+/ Contra IUD/RI

## 2012-04-26 ENCOUNTER — Inpatient Hospital Stay (HOSPITAL_COMMUNITY)
Admission: AD | Admit: 2012-04-26 | Discharge: 2012-04-26 | Disposition: A | Discharge status: Home / Self Care | Payer: Medicaid Other | Source: Ambulatory Visit | Attending: Obstetrics and Gynecology | Admitting: Obstetrics and Gynecology

## 2012-04-26 ENCOUNTER — Encounter (HOSPITAL_COMMUNITY): Payer: Self-pay

## 2012-04-26 DIAGNOSIS — N760 Acute vaginitis: Secondary | ICD-10-CM | POA: Insufficient documentation

## 2012-04-26 DIAGNOSIS — O219 Vomiting of pregnancy, unspecified: Secondary | ICD-10-CM

## 2012-04-26 DIAGNOSIS — O210 Mild hyperemesis gravidarum: Secondary | ICD-10-CM | POA: Insufficient documentation

## 2012-04-26 DIAGNOSIS — O26892 Other specified pregnancy related conditions, second trimester: Secondary | ICD-10-CM

## 2012-04-26 DIAGNOSIS — O99891 Other specified diseases and conditions complicating pregnancy: Secondary | ICD-10-CM | POA: Insufficient documentation

## 2012-04-26 DIAGNOSIS — A499 Bacterial infection, unspecified: Secondary | ICD-10-CM | POA: Insufficient documentation

## 2012-04-26 DIAGNOSIS — B9689 Other specified bacterial agents as the cause of diseases classified elsewhere: Secondary | ICD-10-CM | POA: Insufficient documentation

## 2012-04-26 DIAGNOSIS — R51 Headache: Secondary | ICD-10-CM | POA: Insufficient documentation

## 2012-04-26 DIAGNOSIS — O2390 Unspecified genitourinary tract infection in pregnancy, unspecified trimester: Secondary | ICD-10-CM | POA: Insufficient documentation

## 2012-04-26 DIAGNOSIS — O26899 Other specified pregnancy related conditions, unspecified trimester: Secondary | ICD-10-CM

## 2012-04-26 LAB — URINALYSIS, ROUTINE W REFLEX MICROSCOPIC
Bilirubin Urine: NEGATIVE
Leukocytes, UA: NEGATIVE
Nitrite: NEGATIVE
Specific Gravity, Urine: >1.03 — ABNORMAL HIGH (ref 1.005–1.030)
pH: 5.5 (ref 5.0–8.0)

## 2012-04-26 LAB — POCT PREGNANCY, URINE: Preg Test, Ur: POSITIVE — AB

## 2012-04-26 LAB — WET PREP, GENITAL: Trich, Wet Prep: NONE SEEN

## 2012-04-26 LAB — CBC WITH DIFFERENTIAL/PLATELET
HCT: 39.5 % (ref 36.0–46.0)
Hemoglobin: 13.7 g/dL (ref 12.0–15.0)
Lymphocytes Relative: 23 % (ref 12–46)
Lymphs Abs: 1.6 10*3/uL (ref 0.7–4.0)
Monocytes Absolute: 0.6 10*3/uL (ref 0.1–1.0)
Monocytes Relative: 9 % (ref 3–12)
Neutro Abs: 4.7 10*3/uL (ref 1.7–7.7)
Neutrophils Relative %: 67 % (ref 43–77)
RBC: 4.5 MIL/uL (ref 3.87–5.11)
WBC: 6.9 10*3/uL (ref 4.0–10.5)

## 2012-04-26 LAB — URINE MICROSCOPIC-ADD ON

## 2012-04-26 MED ORDER — PRENATAL COMPLETE 14-0.4 MG PO TABS: 1.0000 | ORAL_TABLET | ORAL | Status: DC

## 2012-04-26 MED ORDER — DEXAMETHASONE SODIUM PHOSPHATE 10 MG/ML IJ SOLN
10.0000 mg | Freq: Once | INTRAMUSCULAR | Status: AC
  Administered 2012-04-26: 10 mg via INTRAVENOUS
  Filled 2012-04-26: qty 1

## 2012-04-26 MED ORDER — DIPHENHYDRAMINE HCL 50 MG/ML IJ SOLN
25.0000 mg | Freq: Once | INTRAMUSCULAR | Status: AC
  Administered 2012-04-26: 25 mg via INTRAVENOUS
  Filled 2012-04-26: qty 1

## 2012-04-26 MED ORDER — SODIUM CHLORIDE 0.9 % IV SOLN
INTRAVENOUS | Status: DC
  Administered 2012-04-26: 18:00:00 via INTRAVENOUS

## 2012-04-26 MED ORDER — PROCHLORPERAZINE EDISYLATE 5 MG/ML IJ SOLN
10.0000 mg | Freq: Four times a day (QID) | INTRAMUSCULAR | Status: DC | PRN
  Administered 2012-04-26: 10 mg via INTRAVENOUS
  Filled 2012-04-26: qty 2

## 2012-04-26 MED ORDER — METRONIDAZOLE 500 MG PO TABS: 500.0000 mg | ORAL_TABLET | Freq: Two times a day (BID) | ORAL | Status: DC

## 2012-04-26 NOTE — MAU Note (Signed)
Patient states she had a positive home pregnancy test on 3-12. States she has had a headache that starts on the rightt side of her neck and radiates up to the top of her head for the past 3 days. Has had abdominal cramping today. Denies bleeding but does have a slight vaginal discharge.

## 2012-04-26 NOTE — MAU Provider Note (Signed)
History     CSN: [id number]  Arrival date and time: 04/26/12 1534   First Provider Initiated Contact with Patient 04/26/12 1642      Chief Complaint  Patient presents with  . Headache  . Possible Pregnancy  . Abdominal Pain   HPI Dawn Velez is 27 y.o. G3P1001 [redacted]w[redacted]d weeks presenting with headache X 3 days unrelieved with tylenol.  Right frontal "pounding".  Hx of migraines.  Had nausea yesterday.   LMP 01/31/12.  + pregnancy test at home.  Plans care with Dr. Ellyn Hack.   Mild cramping that comes and goes X 5 weeks.  Was not using contraception.       Past Medical History  Diagnosis Date  . No pertinent past medical history   . Headache   . Normal pregnancy, first 03/29/2011  . SVD (spontaneous vaginal delivery) 03/30/2011    Past Surgical History  Procedure Laterality Date  . No past surgeries    . I&d extremity      boil on back    Family History  Problem Relation Age of Onset  . Anesthesia problems Neg Hx   . Hypertension Mother   . Hypertension Sister   . Mental retardation Brother   . Mental illness Brother     schizophrenia  . Heart disease Paternal Grandmother   . Drug abuse Paternal Grandfather   . Hypertension Paternal Grandfather   . Alcohol abuse Paternal Uncle     History  Substance Use Topics  . Smoking status: Former Smoker -- 1.00 packs/day for 1 years    Types: Cigarettes    Quit date: 03/30/2010  . Smokeless tobacco: Never Used  . Alcohol Use: No    Allergies: No Known Allergies  Prescriptions prior to admission  Medication Sig Dispense Refill  . acetaminophen (TYLENOL) 500 MG tablet Take 1,000 mg by mouth every 6 (six) hours as needed. Head aches      . Prenatal Vit-Fe Fumarate-FA (PRENATAL MULTIVITAMIN) TABS Take 1 tablet by mouth daily.      . Prenatal Vit-Fe Fumarate-FA (PRENATAL MULTIVITAMIN) TABS Take 1 tablet by mouth daily.  30 tablet  12    Review of Systems  Constitutional: Negative for fever and chills.  Eyes:  Negative for blurred vision, double vision and photophobia.       Occ floaters.  Gastrointestinal: Positive for abdominal pain. Vomiting: occ cramping.  Genitourinary:       + for white thick discharge, neg for vaginal bleeding  Neurological: Positive for headaches. Negative for dizziness, tingling, sensory change, speech change, focal weakness, seizures and loss of consciousness.   Physical Exam   Blood pressure 121/58, pulse 83, temperature 98.6 F (37 C), resp. rate 16, height 5\' 7"  (1.702 m), weight 201 lb 3.2 oz (91.264 kg), last menstrual period 01/31/2012, SpO2 100.00%, unknown if currently breastfeeding.  Physical Exam  Constitutional: She is oriented to person, place, and time. She appears well-developed and well-nourished. No distress.  HENT:  Head: Normocephalic.  Neck: Normal range of motion.  Cardiovascular: Normal rate.   Respiratory: Effort normal.  GI: Soft. There is no tenderness. There is no rebound and no guarding.  Genitourinary: There is no rash, tenderness or lesion on the right labia. There is no rash, tenderness or lesion on the left labia. Uterus is enlarged (measures 12-13 weeks in size). Uterus is not tender. Cervix exhibits no motion tenderness, no discharge and no friability. Right adnexum displays no mass, no tenderness and no fullness. Left adnexum displays  no mass, no tenderness and no fullness. No erythema or bleeding around the vagina. Vaginal discharge (small amount of white discharge without odor) found.  Neurological: She is alert and oriented to person, place, and time. No cranial nerve deficit. Coordination normal.  Skin: Skin is warm and dry.  Psychiatric: She has a normal mood and affect. Her behavior is normal.   Results for orders placed during the hospital encounter of 04/26/12 (from the past 24 hour(s))  URINALYSIS, ROUTINE W REFLEX MICROSCOPIC     Status: Abnormal   Collection Time    04/26/12  4:00 PM      Result Value Range   Color, Urine  YELLOW  YELLOW   APPearance CLEAR  CLEAR   Specific Gravity, Urine >1.030 (*) 1.005 - 1.030   pH 5.5  5.0 - 8.0   Glucose, UA NEGATIVE  NEGATIVE mg/dL   Hgb urine dipstick TRACE (*) NEGATIVE   Bilirubin Urine NEGATIVE  NEGATIVE   Ketones, ur 40 (*) NEGATIVE mg/dL   Protein, ur NEGATIVE  NEGATIVE mg/dL   Urobilinogen, UA 0.2  0.0 - 1.0 mg/dL   Nitrite NEGATIVE  NEGATIVE   Leukocytes, UA NEGATIVE  NEGATIVE  URINE MICROSCOPIC-ADD ON     Status: Abnormal   Collection Time    04/26/12  4:00 PM      Result Value Range   Squamous Epithelial / LPF FEW (*) RARE   WBC, UA 3-6  <3 WBC/hpf   RBC / HPF 0-2  <3 RBC/hpf  POCT PREGNANCY, URINE     Status: Abnormal   Collection Time    04/26/12  4:13 PM      Result Value Range   Preg Test, Ur POSITIVE (*) NEGATIVE  WET PREP, GENITAL     Status: Abnormal   Collection Time    04/26/12  4:45 PM      Result Value Range   Yeast Wet Prep HPF POC NONE SEEN  NONE SEEN   Trich, Wet Prep NONE SEEN  NONE SEEN   Clue Cells Wet Prep HPF POC FEW (*) NONE SEEN   WBC, Wet Prep HPF POC MODERATE (*) NONE SEEN  CBC WITH DIFFERENTIAL     Status: None   Collection Time    04/26/12  4:55 PM      Result Value Range   WBC 6.9  4.0 - 10.5 K/uL   RBC 4.50  3.87 - 5.11 MIL/uL   Hemoglobin 13.7  12.0 - 15.0 g/dL   HCT 32.4  40.1 - 02.7 %   MCV 87.8  78.0 - 100.0 fL   MCH 30.4  26.0 - 34.0 pg   MCHC 34.7  30.0 - 36.0 g/dL   RDW 25.3  66.4 - 40.3 %   Platelets 258  150 - 400 K/uL   Neutrophils Relative 67  43 - 77 %   Neutro Abs 4.7  1.7 - 7.7 K/uL   Lymphocytes Relative 23  12 - 46 %   Lymphs Abs 1.6  0.7 - 4.0 K/uL   Monocytes Relative 9  3 - 12 %   Monocytes Absolute 0.6  0.1 - 1.0 K/uL   Eosinophils Relative 1  0 - 5 %   Eosinophils Absolute 0.0  0.0 - 0.7 K/uL   Basophils Relative 0  0 - 1 %   Basophils Absolute 0.0  0.0 - 0.1 K/uL   MAU Course  Procedures  GC/CHL culture to lab  MDM Treated with IV hydration, compazine 10mg , decadron  10mg  and  Benadryl 25mg  IV 19:00 Patient states she is feeling much better and states her headache is gone  0/10  Assessment and Plan  A:  Headache in pregnancy    Hx of migraine headaches    Nausea and vomiting     Bacterial vaginosis  P:  Patient is requesting a Rx prenatal vits      Keep plans to begin prenatal care     Rx for Flagyl and Prenatal vits to pharmacy    Stressed importance of staying well hydrated       P:   [place] Korver,EVE M 04/26/2012, 4:45 PM

## 2012-04-27 LAB — GC/CHLAMYDIA PROBE AMP: GC Probe RNA: NEGATIVE

## 2012-06-02 LAB — OB RESULTS CONSOLE GC/CHLAMYDIA: Chlamydia: NEGATIVE

## 2012-06-02 LAB — OB RESULTS CONSOLE HIV ANTIBODY (ROUTINE TESTING): HIV: NONREACTIVE

## 2012-06-02 LAB — OB RESULTS CONSOLE ABO/RH: RH Type: POSITIVE

## 2012-06-02 LAB — OB RESULTS CONSOLE RPR: RPR: NONREACTIVE

## 2012-10-08 ENCOUNTER — Inpatient Hospital Stay (HOSPITAL_COMMUNITY)
Admission: AD | Admit: 2012-10-08 | Discharge: 2012-10-08 | Disposition: A | Discharge status: Home / Self Care | Payer: Medicaid Other | Source: Ambulatory Visit | Attending: Obstetrics and Gynecology | Admitting: Obstetrics and Gynecology

## 2012-10-08 ENCOUNTER — Encounter (HOSPITAL_COMMUNITY): Payer: Self-pay | Admitting: *Deleted

## 2012-10-08 DIAGNOSIS — K089 Disorder of teeth and supporting structures, unspecified: Secondary | ICD-10-CM

## 2012-10-08 DIAGNOSIS — O99891 Other specified diseases and conditions complicating pregnancy: Secondary | ICD-10-CM | POA: Insufficient documentation

## 2012-10-08 DIAGNOSIS — K0889 Other specified disorders of teeth and supporting structures: Secondary | ICD-10-CM

## 2012-10-08 MED ORDER — OXYCODONE-ACETAMINOPHEN 10-325 MG PO TABS: 1.0000 | ORAL_TABLET | ORAL | Status: DC | PRN

## 2012-10-08 MED ORDER — CEPHALEXIN 250 MG PO CAPS: 250.0000 mg | ORAL_CAPSULE | Freq: Four times a day (QID) | ORAL | Status: DC

## 2012-10-08 MED ORDER — HYDROMORPHONE HCL PF 1 MG/ML IJ SOLN
1.0000 mg | Freq: Once | INTRAMUSCULAR | Status: AC
  Administered 2012-10-08: 1 mg via INTRAMUSCULAR
  Filled 2012-10-08: qty 1

## 2012-10-08 NOTE — MAU Note (Addendum)
SAYS  SHE BROKE  RIGHT LOWER TOOTH   2-3 MTHS AGO.  HAS  BEEN HURTING SOME- NO MED.     TONIGHT  SHE AWOKE AT 0200-  WITH PAIN IN TOOTH- SHE TOOK 2XS TYLENOL- NO RELIEF.  SHEDOES NOT HAVE A DENTIST

## 2012-10-08 NOTE — MAU Provider Note (Signed)
History     CSN: [id number]  Arrival date and time: 10/08/12 1308   First Provider Initiated Contact with Patient 10/08/12 0507      Chief Complaint  Patient presents with  . Dental Pain   HPI This is a 27 y.o. female at [redacted]w[redacted]d who presents with c/o severe right lower toothache.  States it started 2 mos ago, but got suddenly worse at 2am.  Took Tylenol without relief at home. Has not been to the dentist "since I was 8".  Denies leaking or bleeding. Good fetal movment.   RN Note: SAYS SHE BROKE RIGHT LOWER TOOTH 2-3 MTHS AGO. HAS BEEN HURTING SOME- NO MED. TONIGHT SHE AWOKE AT 0200- WITH PAIN IN TOOTH- SHE TOOK 2XS TYLENOL- NO RELIEF. SHEDOES NOT HAVE A DENTIST  OB History   Grav Para Term Preterm Abortions TAB SAB Ect Mult Living   2 1 1  0 0 0 0 0 0 1      Past Medical History  Diagnosis Date  . No pertinent past medical history   . Headache(784.0)   . Normal pregnancy, first 03/29/2011  . SVD (spontaneous vaginal delivery) 03/30/2011    Past Surgical History  Procedure Laterality Date  . No past surgeries    . I&d extremity      boil on back    Family History  Problem Relation Age of Onset  . Anesthesia problems Neg Hx   . Hypertension Mother   . Hypertension Sister   . Mental retardation Brother   . Mental illness Brother     schizophrenia  . Heart disease Paternal Grandmother   . Drug abuse Paternal Grandfather   . Hypertension Paternal Grandfather   . Alcohol abuse Paternal Uncle     History  Substance Use Topics  . Smoking status: Former Smoker -- 1.00 packs/day for 1 years    Types: Cigarettes    Quit date: 03/30/2010  . Smokeless tobacco: Never Used  . Alcohol Use: No    Allergies: No Known Allergies  Prescriptions prior to admission  Medication Sig Dispense Refill  . acetaminophen (TYLENOL) 500 MG tablet Take 1,000 mg by mouth every 6 (six) hours as needed. Head aches      . calcium carbonate (TUMS - DOSED IN MG ELEMENTAL CALCIUM) 500 MG  chewable tablet Chew 2 tablets by mouth daily as needed for heartburn.      . metroNIDAZOLE (FLAGYL) 500 MG tablet Take 1 tablet (500 mg total) by mouth 2 (two) times daily.  14 tablet  0  . Prenatal Vit-Fe Fumarate-FA (PRENATAL COMPLETE) 14-0.4 MG TABS Take 1 tablet by mouth 1 day or 1 dose.  60 each  2    Review of Systems  Constitutional: Negative for fever and chills.  HENT:       Severe tooth pain right lower jaw   Gastrointestinal: Negative for nausea, vomiting and abdominal pain.   Physical Exam   Blood pressure 132/67, pulse 111, temperature 97.9 F (36.6 C), resp. rate 22, height 5\' 7"  (1.702 m), weight 100.154 kg (220 lb 12.8 oz), last menstrual period 01/31/2012.  Physical Exam  Constitutional: She is oriented to person, place, and time. She appears well-developed and well-nourished. No distress (but crying with pain, holding jaw).  Neck: No tracheal deviation present.  No swelling of jaw or neck Slight erethema of gums Difficult to see tooth affected due to patient intolerance of exam  Cardiovascular: Normal rate and regular rhythm.  Exam reveals no gallop and no  friction rub.   No murmur heard. Respiratory: Effort normal and breath sounds normal. No stridor. No respiratory distress. She has no wheezes. She has no rales.  GI: Soft. There is no tenderness.  Genitourinary:  FHR reactive Irregular uterine cramping   Musculoskeletal: Normal range of motion.  Lymphadenopathy:    She has no cervical adenopathy.  Neurological: She is alert and oriented to person, place, and time.  Skin: Skin is warm and dry.  Psychiatric: She has a normal mood and affect.    MAU Course  Procedures  MDM Given injection of dilaudid as patient was in severe pain, writhing, could not even sit still. Immediately when med injected, RN states patient immediately calmed down and said the pain was 'so much better'.   Assessment and Plan  A:  SIUP at [redacted]w[redacted]d       Tooth pain  P:  Will Rx  analgesic and antibiotic       Advised to seek dental care immediately        Followup in office  [facility] 10/08/2012, 5:15 AM

## 2012-10-08 NOTE — MAU Note (Signed)
Chipped a tooth couple months ago. Toothache since 2400. Took Tylenol and helped alittle but then woke up with worse pain.

## 2012-10-27 ENCOUNTER — Inpatient Hospital Stay (HOSPITAL_COMMUNITY)
Admission: AD | Admit: 2012-10-27 | Discharge: 2012-10-29 | DRG: 775 | Disposition: A | Discharge status: Home / Self Care | Payer: Medicaid Other | Attending: Obstetrics and Gynecology | Admitting: Obstetrics and Gynecology

## 2012-10-27 ENCOUNTER — Encounter (HOSPITAL_COMMUNITY): Payer: Self-pay | Admitting: Obstetrics and Gynecology

## 2012-10-27 DIAGNOSIS — Z3480 Encounter for supervision of other normal pregnancy, unspecified trimester: Secondary | ICD-10-CM

## 2012-10-27 DIAGNOSIS — O99892 Other specified diseases and conditions complicating childbirth: Principal | ICD-10-CM | POA: Diagnosis present

## 2012-10-27 DIAGNOSIS — Z22330 Carrier of Group B streptococcus: Secondary | ICD-10-CM

## 2012-10-27 HISTORY — DX: Encounter for supervision of other normal pregnancy, unspecified trimester: Z34.80

## 2012-10-27 MED ORDER — FENTANYL 2.5 MCG/ML BUPIVACAINE 1/10 % EPIDURAL INFUSION (WH - ANES)
14.0000 mL/h | INTRAMUSCULAR | Status: DC | PRN
  Administered 2012-10-28: 14 mL/h via EPIDURAL
  Filled 2012-10-27: qty 125

## 2012-10-27 MED ORDER — EPHEDRINE 5 MG/ML INJ
10.0000 mg | INTRAVENOUS | Status: DC | PRN
  Filled 2012-10-27: qty 2

## 2012-10-27 MED ORDER — DIPHENHYDRAMINE HCL 50 MG/ML IJ SOLN: 12.5000 mg | INTRAMUSCULAR | Status: DC | PRN

## 2012-10-27 MED ORDER — CITRIC ACID-SODIUM CITRATE 334-500 MG/5ML PO SOLN: 30.0000 mL | ORAL | Status: DC | PRN

## 2012-10-27 MED ORDER — TERBUTALINE SULFATE 1 MG/ML IJ SOLN: 0.2500 mg | Freq: Once | INTRAMUSCULAR | Status: AC | PRN

## 2012-10-27 MED ORDER — ACETAMINOPHEN 325 MG PO TABS: 650.0000 mg | ORAL_TABLET | ORAL | Status: DC | PRN

## 2012-10-27 MED ORDER — LACTATED RINGERS IV SOLN: 500.0000 mL | INTRAVENOUS | Status: DC | PRN

## 2012-10-27 MED ORDER — EPHEDRINE 5 MG/ML INJ
10.0000 mg | INTRAVENOUS | Status: DC | PRN
  Filled 2012-10-27: qty 4
  Filled 2012-10-27: qty 2

## 2012-10-27 MED ORDER — OXYCODONE-ACETAMINOPHEN 5-325 MG PO TABS: 1.0000 | ORAL_TABLET | ORAL | Status: DC | PRN

## 2012-10-27 MED ORDER — OXYTOCIN BOLUS FROM INFUSION: 500.0000 mL | INTRAVENOUS | Status: DC

## 2012-10-27 MED ORDER — LIDOCAINE HCL (PF) 1 % IJ SOLN: 30.0000 mL | INTRAMUSCULAR | Status: DC | PRN

## 2012-10-27 MED ORDER — IBUPROFEN 600 MG PO TABS: 600.0000 mg | ORAL_TABLET | Freq: Four times a day (QID) | ORAL | Status: DC | PRN

## 2012-10-27 MED ORDER — PENICILLIN G POTASSIUM 5000000 UNITS IJ SOLR
5.0000 10*6.[IU] | Freq: Once | INTRAVENOUS | Status: AC
  Administered 2012-10-27: 5 10*6.[IU] via INTRAVENOUS
  Filled 2012-10-27: qty 5

## 2012-10-27 MED ORDER — PHENYLEPHRINE 40 MCG/ML (10ML) SYRINGE FOR IV PUSH (FOR BLOOD PRESSURE SUPPORT)
80.0000 ug | PREFILLED_SYRINGE | INTRAVENOUS | Status: DC | PRN
  Filled 2012-10-27: qty 2

## 2012-10-27 MED ORDER — PHENYLEPHRINE 40 MCG/ML (10ML) SYRINGE FOR IV PUSH (FOR BLOOD PRESSURE SUPPORT)
80.0000 ug | PREFILLED_SYRINGE | INTRAVENOUS | Status: DC | PRN
  Filled 2012-10-27: qty 5
  Filled 2012-10-27: qty 2

## 2012-10-27 MED ORDER — LACTATED RINGERS IV SOLN
500.0000 mL | Freq: Once | INTRAVENOUS | Status: AC
  Administered 2012-10-28: 500 mL via INTRAVENOUS

## 2012-10-27 MED ORDER — BUTORPHANOL TARTRATE 1 MG/ML IJ SOLN
1.0000 mg | INTRAMUSCULAR | Status: DC | PRN
Start: 2012-10-27 — End: 2012-10-28

## 2012-10-27 MED ORDER — LACTATED RINGERS IV SOLN
INTRAVENOUS | Status: DC
  Administered 2012-10-27 – 2012-10-28 (×2): via INTRAVENOUS

## 2012-10-27 MED ORDER — PENICILLIN G POTASSIUM 5000000 UNITS IJ SOLR
2.5000 10*6.[IU] | INTRAMUSCULAR | Status: DC
  Filled 2012-10-27 (×4): qty 2.5

## 2012-10-27 MED ORDER — OXYTOCIN 40 UNITS IN LACTATED RINGERS INFUSION - SIMPLE MED
1.0000 m[IU]/min | INTRAVENOUS | Status: DC
  Administered 2012-10-27: 2 m[IU]/min via INTRAVENOUS
  Filled 2012-10-27: qty 1000

## 2012-10-27 MED ORDER — OXYTOCIN 40 UNITS IN LACTATED RINGERS INFUSION - SIMPLE MED: 62.5000 mL/h | INTRAVENOUS | Status: DC

## 2012-10-27 MED ORDER — ONDANSETRON HCL 4 MG/2ML IJ SOLN: 4.0000 mg | Freq: Four times a day (QID) | INTRAMUSCULAR | Status: DC | PRN

## 2012-10-27 NOTE — MAU Note (Signed)
Leaking clear fld since 2115. Some cramping and back pain

## 2012-10-27 NOTE — MAU Note (Signed)
Dr Ellyn Hack called unit and aware pt has towel between legs and is probably ruptured. Aware pt going to BS from Triage  for further eval.

## 2012-10-27 NOTE — H&P (Signed)
CHRISTINAMARIE TALL is a 27 y.o. female G2P1001 at 38+ with ROM at 9pm with clear fluid.  Continuing to leak.  +FM, no VB, occ ctx.  realtively uncomplicated prenatal care.  Maternal Medical History:  Reason for admission: Rupture of membranes.   Contractions: Frequency: irregular.   Perceived severity is moderate.    Fetal activity: Perceived fetal activity is normal.    Prenatal complications: no prenatal complications Prenatal Complications - Diabetes: none.    OB History   Grav Para Term Preterm Abortions TAB SAB Ect Mult Living   2 1 1  0 0 0 0 0 0 1    G1 3/13 7#13 SVD female G2 present No STD, nl pap Past Medical History  Diagnosis Date  . No pertinent past medical history   . Headache(784.0)   . Normal pregnancy, first 03/29/2011  . SVD (spontaneous vaginal delivery) 03/30/2011  . Normal pregnancy, repeat 10/27/2012   Past Surgical History  Procedure Laterality Date  . No past surgeries    . I&d extremity      boil on back   Family History: family history includes Alcohol abuse in her paternal uncle; Drug abuse in her paternal grandfather; Heart disease in her paternal grandmother; Hypertension in her mother, paternal grandfather, and sister; Mental illness in her brother; Mental retardation in her brother. There is no history of Anesthesia problems. Social History:  reports that she quit smoking about 2 years ago. Her smoking use included Cigarettes. She has a 1 pack-year smoking history. She has never used smokeless tobacco. She reports that she does not drink alcohol or use illicit drugs. Meds PNV All NKDA   Prenatal Transfer Tool  Maternal Diabetes: No Genetic Screening: Normal Maternal Ultrasounds/Referrals: Normal Fetal Ultrasounds or other Referrals:  None Maternal Substance Abuse:  No Significant Maternal Medications:  None Significant Maternal Lab Results:  Lab values include: Group B Strep positive Other Comments:  None  Review of Systems   Constitutional: Negative.   HENT: Negative.   Eyes: Negative.   Respiratory: Negative.   Cardiovascular: Negative.   Gastrointestinal: Negative.   Genitourinary: Negative.   Musculoskeletal: Negative.   Skin: Negative.   Neurological: Negative.   Psychiatric/Behavioral: Negative.       Blood pressure 130/71, pulse 102, temperature 98.3 F (36.8 C), resp. rate 20, height 5\' 7"  (1.702 m), weight 101.061 kg (222 lb 12.8 oz), last menstrual period 01/31/2012. Maternal Exam:  Uterine Assessment: Contraction strength is moderate.  Contraction frequency is irregular.   Abdomen: Fundal height is appropriate for gestation.   Estimated fetal weight is 7.5-8.5#.   Fetal presentation: vertex  Introitus: Normal vulva. Normal vagina.  Amniotic fluid character: clear.  Pelvis: adequate for delivery.   Cervix: Cervix evaluated by digital exam.     Physical Exam  Constitutional: She is oriented to person, place, and time. She appears well-developed and well-nourished.  HENT:  Head: Normocephalic and atraumatic.  Cardiovascular: Normal rate and regular rhythm.   Respiratory: Effort normal and breath sounds normal. No respiratory distress. She has no wheezes.  GI: Soft. Bowel sounds are normal. She exhibits no distension. There is no tenderness.  Musculoskeletal: Normal range of motion.  Neurological: She is alert and oriented to person, place, and time.  Skin: Skin is warm and dry.  Psychiatric: She has a normal mood and affect. Her behavior is normal.    Prenatal labs: ABO, Rh: B/Positive/-- (05/30 0000) Antibody: Negative (05/30 0000) Rubella: Nonimmune (05/30 0000) RPR: Nonreactive (05/30 0000)  HBsAg:  Negative (05/30 0000)  HIV: Non-reactive (05/30 0000)  GBS:   positive  Hgb 14.1/ Pap WNL/ Plt 273K/ Hgb electro WNL/ GC neg/ Chl neg/ CF neg/ AFP WNL/ glucola 78   Nl [place]  Tdap 08/01/12  Assessment/Plan: 27yo G2P1 at 38+ with ROM Pitocin to augment PCN for  prophylaxis Expect SVD Epidural PRN  BOVARD,Justyce Baby 10/27/2012, 10:40 PM

## 2012-10-27 NOTE — Progress Notes (Signed)
Dawn Velez in [facility] called with report. Pt to 162 via w/c from Triage.

## 2012-10-28 ENCOUNTER — Inpatient Hospital Stay (HOSPITAL_COMMUNITY): Payer: Medicaid Other | Admitting: Anesthesiology

## 2012-10-28 ENCOUNTER — Encounter (HOSPITAL_COMMUNITY): Payer: Self-pay | Admitting: Obstetrics and Gynecology

## 2012-10-28 ENCOUNTER — Encounter (HOSPITAL_COMMUNITY): Payer: Medicaid Other | Admitting: Anesthesiology

## 2012-10-28 LAB — CBC
HCT: 43.4 % (ref 36.0–46.0)
Hemoglobin: 14.6 g/dL (ref 12.0–15.0)
MCHC: 33.6 g/dL (ref 30.0–36.0)
MCV: 87.3 fL (ref 78.0–100.0)
Platelets: 271 10*3/uL (ref 150–400)
RDW: 14.7 % (ref 11.5–15.5)
WBC: 9.5 10*3/uL (ref 4.0–10.5)

## 2012-10-28 MED ORDER — CALCIUM CARBONATE ANTACID 500 MG PO CHEW: 2.0000 | CHEWABLE_TABLET | Freq: Every day | ORAL | Status: DC | PRN

## 2012-10-28 MED ORDER — IBUPROFEN 600 MG PO TABS
600.0000 mg | ORAL_TABLET | Freq: Four times a day (QID) | ORAL | Status: DC
  Administered 2012-10-28 – 2012-10-29 (×5): 600 mg via ORAL
  Filled 2012-10-28 (×5): qty 1

## 2012-10-28 MED ORDER — DIPHENHYDRAMINE HCL 25 MG PO CAPS: 25.0000 mg | ORAL_CAPSULE | Freq: Four times a day (QID) | ORAL | Status: DC | PRN

## 2012-10-28 MED ORDER — PRENATAL MULTIVITAMIN CH
1.0000 | ORAL_TABLET | Freq: Every day | ORAL | Status: DC
  Administered 2012-10-28 – 2012-10-29 (×2): 1 via ORAL
  Filled 2012-10-28 (×2): qty 1

## 2012-10-28 MED ORDER — ONDANSETRON HCL 4 MG/2ML IJ SOLN: 4.0000 mg | INTRAMUSCULAR | Status: DC | PRN

## 2012-10-28 MED ORDER — LACTATED RINGERS IV SOLN: INTRAVENOUS | Status: DC

## 2012-10-28 MED ORDER — ZOLPIDEM TARTRATE 5 MG PO TABS: 5.0000 mg | ORAL_TABLET | Freq: Every evening | ORAL | Status: DC | PRN

## 2012-10-28 MED ORDER — BENZOCAINE-MENTHOL 20-0.5 % EX AERO
1.0000 "application " | INHALATION_SPRAY | CUTANEOUS | Status: DC | PRN
  Administered 2012-10-28: 1 via TOPICAL
  Filled 2012-10-28: qty 56

## 2012-10-28 MED ORDER — SODIUM BICARBONATE 8.4 % IV SOLN
INTRAVENOUS | Status: DC | PRN
  Administered 2012-10-28: 5 mL via EPIDURAL

## 2012-10-28 MED ORDER — OXYCODONE-ACETAMINOPHEN 5-325 MG PO TABS: 1.0000 | ORAL_TABLET | ORAL | Status: DC | PRN

## 2012-10-28 MED ORDER — WITCH HAZEL-GLYCERIN EX PADS: 1.0000 "application " | MEDICATED_PAD | CUTANEOUS | Status: DC | PRN

## 2012-10-28 MED ORDER — SENNOSIDES-DOCUSATE SODIUM 8.6-50 MG PO TABS
2.0000 | ORAL_TABLET | ORAL | Status: DC
  Administered 2012-10-29: 2 via ORAL
  Filled 2012-10-28: qty 2

## 2012-10-28 MED ORDER — SIMETHICONE 80 MG PO CHEW: 80.0000 mg | CHEWABLE_TABLET | ORAL | Status: DC | PRN

## 2012-10-28 MED ORDER — ONDANSETRON HCL 4 MG PO TABS: 4.0000 mg | ORAL_TABLET | ORAL | Status: DC | PRN

## 2012-10-28 MED ORDER — LANOLIN HYDROUS EX OINT: TOPICAL_OINTMENT | CUTANEOUS | Status: DC | PRN

## 2012-10-28 MED ORDER — DIBUCAINE 1 % RE OINT: 1.0000 "application " | TOPICAL_OINTMENT | RECTAL | Status: DC | PRN

## 2012-10-28 NOTE — Anesthesia Procedure Notes (Signed)

## 2012-10-28 NOTE — Anesthesia Postprocedure Evaluation (Signed)
  Anesthesia Post-op Note  Patient: Dawn Velez  Procedure(s) Performed: * No procedures listed *  Patient Location: Mother/Baby  Anesthesia Type:Epidural  Level of Consciousness: awake and alert   Airway and Oxygen Therapy: Patient Spontanous Breathing  Post-op Pain: mild  Post-op Assessment: Patient's Cardiovascular Status Stable, Respiratory Function Stable, No signs of Nausea or vomiting, Adequate PO intake, Pain level controlled, No headache and No residual motor weakness  Post-op Vital Signs: stable  Complications: No apparent anesthesia complications

## 2012-10-28 NOTE — Progress Notes (Signed)
Patient ID: Dawn Velez, female   DOB: Jun 10, 1985, 27 y.o.   MRN: [id number]  Comfortable with epidural  AFVSS gen NAD FHTs 130's category 1 toco Q 2-4  SVE 2.3/20/-2  Continue IOL Expect SVD

## 2012-10-28 NOTE — Anesthesia Preprocedure Evaluation (Signed)

## 2012-10-29 ENCOUNTER — Encounter (HOSPITAL_COMMUNITY): Payer: Self-pay | Admitting: *Deleted

## 2012-10-29 LAB — CBC
HCT: 40.1 % (ref 36.0–46.0)
Hemoglobin: 13.3 g/dL (ref 12.0–15.0)
MCH: 29 pg (ref 26.0–34.0)
MCHC: 33.2 g/dL (ref 30.0–36.0)
WBC: 9.4 10*3/uL (ref 4.0–10.5)

## 2012-10-29 MED ORDER — PRENATAL COMPLETE 14-0.4 MG PO TABS: 1.0000 | ORAL_TABLET | ORAL | Status: DC

## 2012-10-29 MED ORDER — MEASLES, MUMPS & RUBELLA VAC ~~LOC~~ INJ
0.5000 mL | INJECTION | Freq: Once | SUBCUTANEOUS | Status: AC
  Administered 2012-10-29: 0.5 mL via SUBCUTANEOUS
  Filled 2012-10-29: qty 0.5

## 2012-10-29 MED ORDER — OXYCODONE-ACETAMINOPHEN 10-325 MG PO TABS: 1.0000 | ORAL_TABLET | Freq: Four times a day (QID) | ORAL | Status: DC | PRN

## 2012-10-29 MED ORDER — IBUPROFEN 800 MG PO TABS: 800.0000 mg | ORAL_TABLET | Freq: Three times a day (TID) | ORAL | Status: DC | PRN

## 2012-10-29 MED ORDER — MEASLES, MUMPS & RUBELLA VAC ~~LOC~~ INJ: 0.5000 mL | INJECTION | Freq: Once | SUBCUTANEOUS | Status: DC

## 2012-10-29 NOTE — Discharge Summary (Signed)
Obstetric Discharge Summary Reason for Admission: onset of labor and rupture of membranes Prenatal Procedures: none Intrapartum Procedures: spontaneous vaginal delivery Postpartum Procedures: none Complications-Operative and Postpartum: vaginal abrasion Hemoglobin  Date Value Range Status  10/29/2012 13.3  12.0 - 15.0 g/dL Final     HCT  Date Value Range Status  10/29/2012 40.1  36.0 - 46.0 % Final    Physical Exam:  General: alert and no distress Lochia: appropriate Uterine Fundus: firm  Discharge Diagnoses: Term Pregnancy-delivered  Discharge Information: Date: 10/29/2012 Activity: pelvic rest Diet: routine Medications: PNV, Ibuprofen and Percocet Condition: stable Instructions: refer to practice specific booklet Discharge to: home Follow-up Information   Follow up with Dawn Mcconathy, MD. Schedule an appointment as soon as possible for a visit in 6 weeks.   Specialty:  Obstetrics and Gynecology   Contact information:   [address] [place] [address] [phone]       Newborn Data: Live born female  Birth Weight: 6 lb 10 oz (3005 g) APGAR: 9, 9  Home with mother.  Dawn Velez 10/29/2012, 9:01 AM

## 2012-10-29 NOTE — Progress Notes (Addendum)
Post Partum Day 1 Subjective: no complaints, up ad lib, voiding, tolerating PO and nl lochia, pain controlled  Objective: Blood pressure 117/74, pulse 74, temperature 97.5 F (36.4 C), temperature source Oral, resp. rate 18, height 5\' 7"  (1.702 m), weight 101.061 kg (222 lb 12.8 oz), last menstrual period 01/31/2012, SpO2 98.00%, unknown if currently breastfeeding.  Physical Exam:  General: alert and no distress Lochia: appropriate Uterine Fundus: firm, likely pedunculated fibroid.(d/w pt)    Recent Labs  10/27/12 2315 10/29/12 0604  HGB 14.6 13.3  HCT 43.4 40.1    Assessment/Plan: Plan for discharge tomorrow.  Routine PP care.  Pt desires d/c to home.  D/c with motrin/percocet/ pnv, f/u 6 weeks   LOS: 2 days   BOVARD,Canyon Lohr 10/29/2012, 8:21 AM

## 2012-10-30 NOTE — Progress Notes (Signed)
Post discharge chart review completed.  

## 2012-11-09 ENCOUNTER — Other Ambulatory Visit: Payer: Self-pay

## 2013-11-05 ENCOUNTER — Encounter (HOSPITAL_COMMUNITY): Payer: Self-pay | Admitting: *Deleted

## 2015-11-14 ENCOUNTER — Ambulatory Visit (HOSPITAL_COMMUNITY)
Admission: EM | Admit: 2015-11-14 | Discharge: 2015-11-14 | Disposition: A | Discharge status: Home / Self Care | Payer: Self-pay | Attending: Emergency Medicine | Admitting: Emergency Medicine

## 2015-11-14 ENCOUNTER — Ambulatory Visit (INDEPENDENT_AMBULATORY_CARE_PROVIDER_SITE_OTHER): Payer: Self-pay

## 2015-11-14 ENCOUNTER — Encounter (HOSPITAL_COMMUNITY): Payer: Self-pay | Admitting: Emergency Medicine

## 2015-11-14 DIAGNOSIS — M25512 Pain in left shoulder: Secondary | ICD-10-CM

## 2015-11-14 DIAGNOSIS — M25612 Stiffness of left shoulder, not elsewhere classified: Secondary | ICD-10-CM

## 2015-11-14 DIAGNOSIS — M79602 Pain in left arm: Secondary | ICD-10-CM

## 2015-11-14 MED ORDER — KETOROLAC TROMETHAMINE 60 MG/2ML IM SOLN
60.0000 mg | Freq: Once | INTRAMUSCULAR | Status: AC
  Administered 2015-11-14: 60 mg via INTRAMUSCULAR

## 2015-11-14 MED ORDER — IBUPROFEN 600 MG PO TABS: 600.0000 mg | ORAL_TABLET | Freq: Four times a day (QID) | ORAL | 0 refills | Status: DC | PRN

## 2015-11-14 MED ORDER — PREDNISONE 20 MG PO TABS: ORAL_TABLET | ORAL | 0 refills | Status: DC

## 2015-11-14 MED ORDER — METHYLPREDNISOLONE ACETATE 40 MG/ML IJ SUSP
40.0000 mg | Freq: Once | INTRAMUSCULAR | Status: AC
  Administered 2015-11-14: 40 mg via INTRAMUSCULAR

## 2015-11-14 MED ORDER — KETOROLAC TROMETHAMINE 60 MG/2ML IM SOLN
INTRAMUSCULAR | Status: AC
Start: 2015-11-14 — End: 2015-11-14
  Filled 2015-11-14: qty 2

## 2015-11-14 MED ORDER — METHYLPREDNISOLONE ACETATE 40 MG/ML IJ SUSP
INTRAMUSCULAR | Status: AC
  Filled 2015-11-14: qty 1

## 2015-11-14 MED ORDER — METHOCARBAMOL 500 MG PO TABS: 500.0000 mg | ORAL_TABLET | Freq: Two times a day (BID) | ORAL | 0 refills | Status: DC

## 2015-11-14 NOTE — ED Triage Notes (Signed)
Pt c/o left arm pain onset yest... States it started out w/mild pain but woke up this am w/increased pain  Sx today lump in the biceps  Denies inj/trauma, strenuous activity  Pain increases w/activity  A&O x4... NAD

## 2015-11-14 NOTE — ED Provider Notes (Signed)
CSN: [id number]     Arrival date & time 11/14/15  1424 History   First MD Initiated Contact with Patient 11/14/15 1443     Chief Complaint  Patient presents with  . Arm Pain   (Consider location/radiation/quality/duration/timing/severity/associated sxs/prior Treatment) HPI Dawn Velez is a 30 y.o. female presenting to UC with c/o Left shoulder pain that started after she got home from work yesterday.  Pain was mildly aching and sore, worse when lying on her Left side so she slept on her back and Right side last night.  Today, pain is moderate to severe, limited ROM due to severe pain in posterior shoulder and over her deltoid.  She also thinks she feels there is a "lump" on her bicep.  Pain was worse with ice last night but better in a warm shower. She denies known injury. Denies heavy lifting, falls, or overhead reaching. She is Right hand dominant. 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  She did the majority of her work today with her Right arm.  She has not tried anything for pain today. No prior injury or surgery to Left shoulder or arm. Denies neck or back pain.  She does have birth control in her Left upper arm and notes it is suppose to come out in December and wonders if that is the cause.    Past Medical History:  Diagnosis Date  . Headache(784.0)   . No pertinent past medical history   . Normal pregnancy, first 03/29/2011  . Normal pregnancy, repeat 10/27/2012  . SVD (spontaneous vaginal delivery) 03/30/2011  . SVD (spontaneous vaginal delivery) 10/28/2012   Past Surgical History:  Procedure Laterality Date  . I&D EXTREMITY     boil on back  . NO PAST SURGERIES     Family History  Problem Relation Age of Onset  . Hypertension Mother   . Hypertension Sister   . Mental retardation Brother   . Mental illness Brother     schizophrenia  . Heart disease Paternal Grandmother   . Drug abuse Paternal  Grandfather   . Hypertension Paternal Grandfather   . Alcohol abuse Paternal Uncle   . Anesthesia problems Neg Hx    Social History  Substance Use Topics  . Smoking status: Former Smoker    Packs/day: 1.00    Years: 1.00    Types: Cigarettes    Quit date: 03/30/2010  . Smokeless tobacco: Never Used  . Alcohol use No   OB History    Gravida Para Term Preterm AB Living   '2 2 2 '$ 0 0 2   SAB TAB Ectopic Multiple Live Births   0 0 0 0 2     Review of Systems  Constitutional: Negative for chills and fever.  Musculoskeletal: Positive for back pain and myalgias. Negative for joint swelling, neck pain and neck stiffness.  Skin: Negative for color change and wound.  Neurological: Positive for weakness. Negative for numbness.    Allergies  Patient has no known allergies.  Home Medications   Prior to Admission medications   Medication Sig Start Date End Date Taking? Authorizing Provider  acetaminophen (TYLENOL) 500 MG tablet Take 1,000 mg by mouth every 6 (six) hours as needed. Head aches   Yes Historical Provider, MD  calcium carbonate (TUMS - DOSED IN MG ELEMENTAL CALCIUM) 500 MG chewable tablet Chew 2 tablets by mouth daily as needed for heartburn.  Historical Provider, MD  ibuprofen (ADVIL,MOTRIN) 600 MG tablet Take 1 tablet (600 mg total) by mouth every 6 (six) hours as needed. 11/14/15   Noland Fordyce, PA-C  ibuprofen (ADVIL,MOTRIN) 800 MG tablet Take 1 tablet (800 mg total) by mouth every 8 (eight) hours as needed for pain. 10/29/12   Janyth Contes, MD  measles, mumps and rubella vaccine (MMR) injection Inject 0.5 mLs into the skin once. 10/29/12   Janyth Contes, MD  methocarbamol (ROBAXIN) 500 MG tablet Take 1 tablet (500 mg total) by mouth 2 (two) times daily. 11/14/15   Noland Fordyce, PA-C  oxyCODONE-acetaminophen (PERCOCET) 10-325 MG per tablet Take 1 tablet by mouth every 6 (six) hours as needed for pain. 10/29/12   Janyth Contes, MD  predniSONE  (DELTASONE) 20 MG tablet 3 tabs po day one, then 2 po daily x 4 days 11/14/15   Noland Fordyce, PA-C  Prenatal Vit-Fe Fumarate-FA (PRENATAL COMPLETE) 14-0.4 MG TABS Take 1 tablet by mouth 1 day or 1 dose. 10/29/12   Janyth Contes, MD   Meds Ordered and Administered this Visit   Medications  ketorolac (TORADOL) injection 60 mg (60 mg Intramuscular Given 11/14/15 1501)  methylPREDNISolone acetate (DEPO-MEDROL) injection 40 mg (40 mg Intramuscular Given 11/14/15 1501)    BP 116/76 (BP Location: Right Arm)   Pulse 70   Temp 97.8 F (36.6 C) (Oral)   Resp 20   LMP 11/14/2015   SpO2 100%   Breastfeeding? No  No data found.   Physical Exam  Constitutional: She is oriented to person, place, and time. She appears well-developed and well-nourished.  HENT:  Head: Normocephalic and atraumatic.  Eyes: EOM are normal.  Neck: Normal range of motion.  Cardiovascular: Normal rate.   Pulses:      Radial pulses are 2+ on the left side.  Pulmonary/Chest: Effort normal.  Musculoskeletal: She exhibits tenderness. She exhibits no edema.  Left shoulder and upper arm: no obvious deformity or edema. Tenderness over upper trapezius and posterior shoulder. Mild tenderness over deltoid. Limited ROM due to severe pain and stiffness.   Left upper inner arm- 1in rod palpated c/w reported implated birth control. Minimally tender. No edema, erythema, or warmth.  Full ROM elbow w/o pain or tenderness.  Neurological: She is alert and oriented to person, place, and time.  Skin: Skin is warm and dry. Capillary refill takes less than 2 seconds. No rash noted. No erythema.  Psychiatric: She has a normal mood and affect. Her behavior is normal.  Nursing note and vitals reviewed.   Urgent Care Course   Clinical Course     Procedures (including critical care time)  Labs Review Labs Reviewed - No data to display  Imaging Review Dg Shoulder Left  Result Date: 11/14/2015 CLINICAL DATA:  Left shoulder  pain with no trauma. EXAM: LEFT SHOULDER - 2+ VIEW COMPARISON:  None. FINDINGS: There is no evidence of fracture or dislocation. There is no evidence of arthropathy or other focal bone abnormality. Soft tissues are unremarkable. IMPRESSION: Negative. Electronically Signed   By: Dorise Bullion III M.D   On: 11/14/2015 16:07     MDM   1. Left arm pain   2. Acute pain of left shoulder   3. Shoulder stiffness, left    Pt c/o gradually worsening Left shoulder pain and stiffness since yesterday.  No known injury. Limited ROM on exam. Tenderness to Left upper trapezius and shoulder muscles. No erythema or warmth concerning for infection.  Plain films: no acute findings.  Pain likely due to muscle spasm. Rx: robaxin, ibuprofen and prednisone    Noland Fordyce, PA-C 11/14/15 1637

## 2015-11-14 NOTE — Discharge Instructions (Signed)
°  Robaxin (methocarbamol) is a muscle relaxer and may cause drowsiness. Do not drink alcohol, drive, or operate heavy machinery while taking. ° °

## 2017-02-28 ENCOUNTER — Encounter (HOSPITAL_COMMUNITY): Payer: Self-pay | Admitting: Emergency Medicine

## 2017-02-28 ENCOUNTER — Inpatient Hospital Stay (HOSPITAL_COMMUNITY)
Admission: AD | Admit: 2017-02-28 | Discharge: 2017-03-04 | DRG: 885 | Disposition: A | Discharge status: Home / Self Care | Payer: BLUE CROSS/BLUE SHIELD | Source: Intra-hospital | Attending: Psychiatry | Admitting: Psychiatry

## 2017-02-28 ENCOUNTER — Emergency Department (HOSPITAL_COMMUNITY)
Admission: EM | Admit: 2017-02-28 | Discharge: 2017-02-28 | Disposition: A | Discharge status: Other Acute Inpatient Hospital | Payer: Self-pay | Attending: Emergency Medicine | Admitting: Emergency Medicine

## 2017-02-28 ENCOUNTER — Other Ambulatory Visit: Payer: Self-pay

## 2017-02-28 DIAGNOSIS — Z791 Long term (current) use of non-steroidal anti-inflammatories (NSAID): Secondary | ICD-10-CM | POA: Diagnosis not present

## 2017-02-28 DIAGNOSIS — Z79899 Other long term (current) drug therapy: Secondary | ICD-10-CM

## 2017-02-28 DIAGNOSIS — Z87891 Personal history of nicotine dependence: Secondary | ICD-10-CM | POA: Diagnosis not present

## 2017-02-28 DIAGNOSIS — Z046 Encounter for general psychiatric examination, requested by authority: Secondary | ICD-10-CM | POA: Insufficient documentation

## 2017-02-28 DIAGNOSIS — F410 Panic disorder [episodic paroxysmal anxiety] without agoraphobia: Secondary | ICD-10-CM | POA: Diagnosis present

## 2017-02-28 DIAGNOSIS — Z811 Family history of alcohol abuse and dependence: Secondary | ICD-10-CM | POA: Diagnosis not present

## 2017-02-28 DIAGNOSIS — Z810 Family history of intellectual disabilities: Secondary | ICD-10-CM

## 2017-02-28 DIAGNOSIS — R450 Nervousness: Secondary | ICD-10-CM | POA: Diagnosis not present

## 2017-02-28 DIAGNOSIS — F332 Major depressive disorder, recurrent severe without psychotic features: Secondary | ICD-10-CM | POA: Diagnosis present

## 2017-02-28 DIAGNOSIS — F419 Anxiety disorder, unspecified: Secondary | ICD-10-CM | POA: Insufficient documentation

## 2017-02-28 DIAGNOSIS — Z79891 Long term (current) use of opiate analgesic: Secondary | ICD-10-CM

## 2017-02-28 DIAGNOSIS — Z8249 Family history of ischemic heart disease and other diseases of the circulatory system: Secondary | ICD-10-CM | POA: Diagnosis not present

## 2017-02-28 DIAGNOSIS — F322 Major depressive disorder, single episode, severe without psychotic features: Secondary | ICD-10-CM | POA: Diagnosis present

## 2017-02-28 DIAGNOSIS — Z634 Disappearance and death of family member: Secondary | ICD-10-CM | POA: Diagnosis not present

## 2017-02-28 DIAGNOSIS — Z813 Family history of other psychoactive substance abuse and dependence: Secondary | ICD-10-CM | POA: Diagnosis not present

## 2017-02-28 DIAGNOSIS — Z818 Family history of other mental and behavioral disorders: Secondary | ICD-10-CM | POA: Diagnosis not present

## 2017-02-28 DIAGNOSIS — F329 Major depressive disorder, single episode, unspecified: Secondary | ICD-10-CM | POA: Insufficient documentation

## 2017-02-28 DIAGNOSIS — G4700 Insomnia, unspecified: Secondary | ICD-10-CM | POA: Diagnosis present

## 2017-02-28 LAB — BASIC METABOLIC PANEL
Anion gap: 10 (ref 5–15)
BUN: 12 mg/dL (ref 6–20)
CALCIUM: 9.5 mg/dL (ref 8.9–10.3)
CO2: 24 mmol/L (ref 22–32)
Chloride: 107 mmol/L (ref 101–111)
Creatinine, Ser: 0.8 mg/dL (ref 0.44–1.00)
Glucose, Bld: 99 mg/dL (ref 65–99)
POTASSIUM: 3.7 mmol/L (ref 3.5–5.1)
Sodium: 141 mmol/L (ref 135–145)

## 2017-02-28 LAB — RAPID URINE DRUG SCREEN, HOSP PERFORMED
Amphetamines: NOT DETECTED
BARBITURATES: NOT DETECTED
Benzodiazepines: NOT DETECTED
COCAINE: NOT DETECTED
Opiates: NOT DETECTED
TETRAHYDROCANNABINOL: NOT DETECTED

## 2017-02-28 LAB — CBC WITH DIFFERENTIAL/PLATELET
BASOS PCT: 0 %
Basophils Absolute: 0 10*3/uL (ref 0.0–0.1)
EOS PCT: 2 %
Eosinophils Absolute: 0.1 10*3/uL (ref 0.0–0.7)
HCT: 42.4 % (ref 36.0–46.0)
Hemoglobin: 14.2 g/dL (ref 12.0–15.0)
Lymphocytes Relative: 27 %
Lymphs Abs: 1.9 10*3/uL (ref 0.7–4.0)
MCH: 30.7 pg (ref 26.0–34.0)
MCHC: 33.5 g/dL (ref 30.0–36.0)
MCV: 91.6 fL (ref 78.0–100.0)
Monocytes Absolute: 0.7 10*3/uL (ref 0.1–1.0)
Monocytes Relative: 10 %
NEUTROS ABS: 4.2 10*3/uL (ref 1.7–7.7)
Neutrophils Relative %: 61 %
PLATELETS: 294 10*3/uL (ref 150–400)
RBC: 4.63 MIL/uL (ref 3.87–5.11)
RDW: 12.9 % (ref 11.5–15.5)
WBC: 6.9 10*3/uL (ref 4.0–10.5)

## 2017-02-28 LAB — ETHANOL

## 2017-02-28 LAB — SALICYLATE LEVEL

## 2017-02-28 LAB — ACETAMINOPHEN LEVEL: Acetaminophen (Tylenol), Serum: <10 ug/mL — ABNORMAL LOW (ref 10–30)

## 2017-02-28 LAB — I-STAT BETA HCG BLOOD, ED (MC, WL, AP ONLY)

## 2017-02-28 MED ORDER — LORAZEPAM 2 MG/ML IJ SOLN
INTRAMUSCULAR | Status: AC
  Filled 2017-02-28: qty 1

## 2017-02-28 MED ORDER — BUSPIRONE HCL 15 MG PO TABS
7.5000 mg | ORAL_TABLET | Freq: Two times a day (BID) | ORAL | Status: DC
  Filled 2017-02-28: qty 1

## 2017-02-28 MED ORDER — ACETAMINOPHEN 325 MG PO TABS
650.0000 mg | ORAL_TABLET | Freq: Once | ORAL | Status: AC
  Administered 2017-02-28: 650 mg via ORAL
  Filled 2017-02-28: qty 2

## 2017-02-28 MED ORDER — CITALOPRAM HYDROBROMIDE 10 MG PO TABS
10.0000 mg | ORAL_TABLET | Freq: Every day | ORAL | Status: DC
  Administered 2017-02-28: 10 mg via ORAL
  Filled 2017-02-28: qty 1

## 2017-02-28 MED ORDER — LORAZEPAM 2 MG/ML IJ SOLN
1.0000 mg | Freq: Once | INTRAMUSCULAR | Status: AC
  Administered 2017-02-28: 1 mg via INTRAMUSCULAR

## 2017-02-28 NOTE — ED Notes (Signed)
Pt informed of Room assignment 400-1.

## 2017-02-28 NOTE — ED Notes (Signed)
[facility] requesting to delay transfer of pt until [id number].
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
## 2017-02-28 NOTE — ED Provider Notes (Signed)
[facility] DEPT Provider Note   CSN: [id number] Arrival date & time: 02/28/17  1452     History   Chief Complaint Chief Complaint  Patient presents with  . Panic Attack    HPI Dawn Velez is a 32 y.o. female presents for evaluation of an anxiety attack.  Patient was driving to work when she started hyperventilating and becoming very anxious.  Patient made it into her job but was unable to go to work and called EMS for further evaluation.  On ED arrival, patient is very tearful, hyperventilating.  Patient states that she does not know what brought it on.  She states that she was driving to work and she started thinking about "all the things going on in her life, which caused her to become very anxious."  Patient reports that this is been a ongoing issue that is been progressively worsening over the last several weeks.  Patient states that she was becoming concerned about the thoughts that she was having and so she scheduled herself an appointment with an outpatient therapist.  She states that she had 2 visits with the outpatient therapist.  She does not have a history of diagnosed depression or anxiety and is not currently on any medication for those disorders.  Patient lives with mom who is here in the ED with her.  Per mom, she has noticed a significant change in patient's behavior over the last 2 weeks.  She reports that patient has become less talkative and more withdrawn.  She states that additionally, patient does no longer want to participate in things that she usually did.  Mom reports that she will intermittently have episodes of extreme crying where she is inconsolable.  Mom states that her patient's dad killed himself in 53.  Mom reports that most recently, patient's husband and several friends have passed away which mom believes that has triggered patient's feelings.  Patient states that she thinks about what would happen if "she was not here"but  then thinks about her kids and says that is "not an option."  Denies any suicidal ideation or plans of suicide.  She denies any HI.  She states that she hears her own voice fixating on her thoughts that repeat.  Patient reports that she worries about what will happen to her mom, what will happen to her, what will happen to her kids and states that she cannot get her self to stop thinking about these thoughts.  She reports these intrusive thoughts and influence her work and her personal life.  Patient denies any auditory hallucinations.  She denies any smoking, cocaine, heroin, marijuana use.  Patient denies any alcohol use.  Patient currently lives with her mom and there are no access to weapons in the house.  The history is provided by the patient and a relative.    Past Medical History:  Diagnosis Date  . Headache(784.0)   . No pertinent past medical history   . Normal pregnancy, first 03/29/2011  . Normal pregnancy, repeat 10/27/2012  . SVD (spontaneous vaginal delivery) 03/30/2011  . SVD (spontaneous vaginal delivery) 10/28/2012    Patient Active Problem List   Diagnosis Date Noted  . SVD (spontaneous vaginal delivery) 10/28/2012  . Normal pregnancy, repeat 10/27/2012    Past Surgical History:  Procedure Laterality Date  . I&D EXTREMITY     boil on back  . NO PAST SURGERIES      OB History    Gravida Para Term Preterm AB  Living   _0 0 0 2   SAB TAB Ectopic Multiple Live Births   0 0 0 0 2       Home Medications    Prior to Admission medications   Medication Sig Start Date End Date Taking? Authorizing Provider  acetaminophen (TYLENOL) 500 MG tablet Take 1,000 mg by mouth every 6 (six) hours as needed. Head aches   Yes [provider]  ibuprofen (ADVIL,MOTRIN) 600 MG tablet Take 1 tablet (600 mg total) by mouth every 6 (six) hours as needed. Patient not taking: Reported on 02/28/2017 11/14/15   Noe Gens, PA-C  ibuprofen (ADVIL,MOTRIN) 800 MG tablet Take 1  tablet (800 mg total) by mouth every 8 (eight) hours as needed for pain. Patient not taking: Reported on 02/28/2017 10/29/12   Bovard-Stuckert, Jeral Fruit, MD  measles, mumps and rubella vaccine (MMR) injection Inject 0.5 mLs into the skin once. Patient not taking: Reported on 02/28/2017 10/29/12   Bovard-Stuckert, Jeral Fruit, MD  methocarbamol (ROBAXIN) 500 MG tablet Take 1 tablet (500 mg total) by mouth 2 (two) times daily. Patient not taking: Reported on 02/28/2017 11/14/15   Noe Gens, PA-C  oxyCODONE-acetaminophen (PERCOCET) 10-325 MG per tablet Take 1 tablet by mouth every 6 (six) hours as needed for pain. 10/29/12   Bovard-Stuckert, Jeral Fruit, MD  predniSONE (DELTASONE) 20 MG tablet 3 tabs po day one, then 2 po daily x 4 days Patient not taking: Reported on 02/28/2017 11/14/15   Noe Gens, PA-C  Prenatal Vit-Fe Fumarate-FA (PRENATAL COMPLETE) 14-0.4 MG TABS Take 1 tablet by mouth 1 day or 1 dose. Patient not taking: Reported on 02/28/2017 10/29/12   Janyth Contes, MD    Family History Family History  Problem Relation Age of Onset  . Hypertension Mother   . Hypertension Sister   . Mental retardation Brother   . Mental illness Brother        schizophrenia  . Heart disease Paternal Grandmother   . Drug abuse Paternal Grandfather   . Hypertension Paternal Grandfather   . Alcohol abuse Paternal Uncle   . Anesthesia problems Neg Hx     Social History Social History   Tobacco Use  . Smoking status: Former Smoker    Packs/day: 1.00    Years: 1.00    Pack years: 1.00    Types: Cigarettes    Last attempt to quit: 03/30/2010    Years since quitting: 6.9  . Smokeless tobacco: Never Used  Substance Use Topics  . Alcohol use: No  . Drug use: No     Allergies   Patient has no known allergies.   Review of Systems Review of Systems  Constitutional: Negative for fever.  Respiratory: Negative for cough and shortness of breath.   Cardiovascular: Negative for chest pain.    Gastrointestinal: Negative for abdominal pain, nausea and vomiting.  Musculoskeletal: Negative for back pain and neck pain.  Skin: Negative for rash.  Neurological: Negative for dizziness, weakness, numbness and headaches.  Psychiatric/Behavioral: Positive for dysphoric mood. Negative for self-injury. The patient is nervous/anxious.   All other systems reviewed and are negative.    Physical Exam Updated Vital Signs BP (!) 119/57 (BP Location: Left Arm)   Pulse 85   Temp 98.3 F (36.8 C) (Oral)   Resp 19   SpO2 96%   Physical Exam  Constitutional: She is oriented to person, place, and time. She appears well-developed and well-nourished.  Initially very tearful. More cooperative after meds.   HENT:  Head: Normocephalic and atraumatic.  Mouth/Throat: Oropharynx is clear and moist and mucous membranes are normal.  Eyes: Conjunctivae, EOM and lids are normal. Pupils are equal, round, and reactive to light.  Neck: Full passive range of motion without pain.  Cardiovascular: Normal rate, regular rhythm, normal heart sounds and normal pulses. Exam reveals no gallop and no friction rub.  No murmur heard. Pulmonary/Chest: Effort normal and breath sounds normal. Tachypnea noted.  Abdominal: Soft. Normal appearance. There is no tenderness. There is no rigidity and no guarding.  Musculoskeletal: Normal range of motion.  Neurological: She is alert and oriented to person, place, and time.  Skin: Skin is warm and dry. Capillary refill takes less than 2 seconds.  Psychiatric: Her speech is normal. Her mood appears anxious. She exhibits a depressed mood. She expresses no homicidal and no suicidal ideation. She expresses no suicidal plans and no homicidal plans.  Nursing note and vitals reviewed.    ED Treatments / Results  Labs (all labs ordered are listed, but only abnormal results are displayed) Labs Reviewed  ACETAMINOPHEN LEVEL - Abnormal; Notable for the following components:       Result Value   Acetaminophen (Tylenol), Serum <10 (*)    All other components within normal limits  RAPID URINE DRUG SCREEN, HOSP PERFORMED  CBC WITH DIFFERENTIAL/PLATELET  BASIC METABOLIC PANEL  ETHANOL  SALICYLATE LEVEL  I-STAT BETA HCG BLOOD, ED ([facility], [facility], [facility] ONLY)    EKG  EKG Interpretation None       Radiology No results found.  Procedures Procedures (including critical care time)  Medications Ordered in ED Medications  citalopram (CELEXA) tablet 10 mg (10 mg Oral Given 02/28/17 1803)  busPIRone (BUSPAR) tablet 7.5 mg (7.5 mg Oral Refused 02/28/17 2132)  LORazepam (ATIVAN) injection 1 mg (1 mg Intramuscular Given 02/28/17 1554)  acetaminophen (TYLENOL) tablet 650 mg (650 mg Oral Given 02/28/17 1803)     Initial Impression / Assessment and Plan / ED Course  I have reviewed the triage vital signs and the nursing notes.  Pertinent labs & imaging results that were available during my care of the patient were reviewed by me and considered in my medical decision making (see chart for details).     32 year old female who presents for evaluation of panic attack that occurred this afternoon.  Patient was going to work when she started feeling overwhelmed by all her thoughts became very anxious.  Patient reports that she had difficulty breathing and could not stop crying.  Patient called EMS for symptoms.  Patient has no diagnosed history of depression or anxiety but both patient and mom report over the last few weeks, patient has had progressively worsening dysphoric mood, depressive thoughts, anxiety.  Patient reports that she started noticing some issues and is scheduled herself with an outpatient therapist appointment.  Mom states that patient has had a lot of stressful events over the last few weeks that mom feels like his triggered these episodes.  Mom notes a market change in the last 2 weeks and behavior as mom states that she has been more withdrawn, less talkative and had more  anxiety.  Patient denies any SI but states that she does wonder about what would happen if she was not here anymore.  No active hallucinations, homicidal ideations.  On my initial ED arrival, patient was hyperventilating and was very tearful.  Patient given Ativan which helped calm her down.  On repeat evaluation, patient was breathing unlabored and was able to talk without  any difficulty.  Concern for depression versus anxiety vs panic attack.  We will plan for TTS consultation medical clearance labs.  Acetaminophen level unremarkable.  Ethanol level unremarkable.  CBC and BMP unremarkable.  Salicylate level normal.  I-STAT beta is normal.  Discussed with TTS after evaluation.  They are recommending inpatient admission and upon medical clearance, patient will be transferred to behavioral health Hospital.  Discussed plan with both patient and patient's mom.  Patient is voluntary for treatment.  Patient is medically cleared. Will be transferred to [facility]   Final Clinical Impressions(s) / ED Diagnoses   Final diagnoses:  Anxiety  Major depressive disorder with current active episode, unspecified depression episode severity, unspecified whether recurrent    ED Discharge Orders    None       Desma Mcgregor 02/28/17 2251    Julianne Rice, MD 03/01/17 [phone]

## 2017-02-28 NOTE — ED Notes (Signed)
Bed: [facility] Expected date:  Expected time:  Means of arrival:  Comments: 

## 2017-02-28 NOTE — ED Triage Notes (Signed)
Patient brought in by EMS from [facility] for a panic attack. Mother states she has lost several people in her life recently and been going through a lot and had a break down before work. Mother thinks she needs to be started on some medication.

## 2017-02-28 NOTE — ED Notes (Signed)
Mother- Luci BankDarlene Parker [phone]

## 2017-02-28 NOTE — ED Notes (Signed)
Pt informed transport delay until 2245-2300.  Pt verbalized understanding.

## 2017-02-28 NOTE — ED Notes (Signed)
[facility]  Delorise Jacksonori stated pt can come after 2130, accepting is Dr. Jama Flavorsobos, Room 400-1.

## 2017-02-28 NOTE — ED Notes (Signed)
Pt changed in to paper scrubs, wanded by Security.

## 2017-02-28 NOTE — ED Notes (Signed)
Bed: [id number] Expected date:  Expected time:  Means of arrival:  Comments: hold

## 2017-03-01 ENCOUNTER — Encounter (HOSPITAL_COMMUNITY): Payer: Self-pay

## 2017-03-01 ENCOUNTER — Other Ambulatory Visit: Payer: Self-pay

## 2017-03-01 DIAGNOSIS — Z818 Family history of other mental and behavioral disorders: Secondary | ICD-10-CM

## 2017-03-01 DIAGNOSIS — F410 Panic disorder [episodic paroxysmal anxiety] without agoraphobia: Secondary | ICD-10-CM

## 2017-03-01 DIAGNOSIS — F322 Major depressive disorder, single episode, severe without psychotic features: Secondary | ICD-10-CM | POA: Diagnosis present

## 2017-03-01 DIAGNOSIS — Z811 Family history of alcohol abuse and dependence: Secondary | ICD-10-CM

## 2017-03-01 DIAGNOSIS — F332 Major depressive disorder, recurrent severe without psychotic features: Principal | ICD-10-CM

## 2017-03-01 DIAGNOSIS — Z634 Disappearance and death of family member: Secondary | ICD-10-CM

## 2017-03-01 DIAGNOSIS — Z813 Family history of other psychoactive substance abuse and dependence: Secondary | ICD-10-CM

## 2017-03-01 MED ORDER — ALUM & MAG HYDROXIDE-SIMETH 200-200-20 MG/5ML PO SUSP: 30.0000 mL | ORAL | Status: DC | PRN

## 2017-03-01 MED ORDER — TRAZODONE HCL 50 MG PO TABS
50.0000 mg | ORAL_TABLET | Freq: Every evening | ORAL | Status: DC | PRN
  Administered 2017-03-01 – 2017-03-03 (×3): 50 mg via ORAL
  Filled 2017-03-01 (×2): qty 1
  Filled 2017-03-01: qty 7
  Filled 2017-03-01: qty 1

## 2017-03-01 MED ORDER — MAGNESIUM HYDROXIDE 400 MG/5ML PO SUSP: 30.0000 mL | Freq: Every day | ORAL | Status: DC | PRN

## 2017-03-01 MED ORDER — SERTRALINE HCL 25 MG PO TABS
25.0000 mg | ORAL_TABLET | Freq: Every day | ORAL | Status: DC
  Administered 2017-03-01 – 2017-03-02 (×2): 25 mg via ORAL
  Filled 2017-03-01 (×3): qty 1

## 2017-03-01 MED ORDER — ACETAMINOPHEN 325 MG PO TABS
650.0000 mg | ORAL_TABLET | Freq: Four times a day (QID) | ORAL | Status: DC | PRN
  Administered 2017-03-01: 650 mg via ORAL
  Filled 2017-03-01: qty 2

## 2017-03-01 MED ORDER — HYDROXYZINE HCL 25 MG PO TABS
25.0000 mg | ORAL_TABLET | Freq: Four times a day (QID) | ORAL | Status: DC | PRN
  Administered 2017-03-01 – 2017-03-04 (×7): 25 mg via ORAL
  Filled 2017-03-01 (×4): qty 1
  Filled 2017-03-01: qty 10
  Filled 2017-03-01 (×3): qty 1

## 2017-03-01 NOTE — Tx Team (Signed)
Initial Treatment Plan 03/01/2017 1:02 AM Dawn Velez [id number]    PATIENT STRESSORS: Educational concerns Occupational concerns Traumatic event   PATIENT STRENGTHS: Wellsite geologistCommunication skills General fund of knowledge Supportive family/friends   PATIENT IDENTIFIED PROBLEMS: Depression  Anxiety  "I want the anxiety better"  "Not worry so much"               DISCHARGE CRITERIA:  Improved stabilization in mood, thinking, and/or behavior Verbal commitment to aftercare and medication compliance  PRELIMINARY DISCHARGE PLAN: Outpatient therapy Medication management  PATIENT/FAMILY INVOLVEMENT: This treatment plan has been presented to and reviewed with the patient, Dawn Velez.  The patient and family have been given the opportunity to ask questions and make suggestions.  Levin BaconHeather V Valentine Kuechle, RN 03/01/2017, 1:02 AM

## 2017-03-01 NOTE — [facility] Group Notes (Signed)
[facility] Group Notes: Meditation  Date:  03/01/2017  Time:  4:37 PM  Type of Therapy:  Psychoeducational Skills  Participation Level:  Minimal  Participation Quality:  Inattentive  Affect:  Depressed  Cognitive:  Alert  Insight:  Lacking  Engagement in Group:  Limited  Modes of Intervention:  Activity and Education  Summary of Progress/Problems: Patient attended group but only participated in meditation for a short period of time.   Marzetta BoardDopson, Divon Krabill E 03/01/2017, 4:37 PM

## 2017-03-01 NOTE — Progress Notes (Signed)
Dawn Velez is a 32 year old female being admitted voluntarily to [facility] from [facility].  She came to the ED accompanied by her mother for panic attacks, not taking care of herself and difficulties at school/work.  She has multiple losses since the age of 89 when her father committed suicide.  She has constant worry that she is going to lose someone.  During [facility] admission, she denies SI/HI or A/V hallucinations.  She continues to report being anxious and depressed.  "I worry all the time that I am going to lose someone else."  She was pleasant and cooperative but very sleepy.  She denies any pain or discomfort and appears to be in no physical distress.  Oriented her to the unit.  Admission paperwork completed and signed.  Belongings searched and secured in locker # 28, no contraband found.  Skin assessment completed and no skin issues noted.  Q 15 minute checks initiated for safety.  We will continue to monitor the progress towards her goals.
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
## 2017-03-01 NOTE — H&P (Signed)
Psychiatric Admission Assessment Adult  Patient Identification: Dawn Velez MRN:  [id number] Date of Evaluation:  03/01/2017 Chief Complaint:  MDD Principal Diagnosis: MDD                                          Panic Disorder Diagnosis:   Patient Active Problem List   Diagnosis Date Noted  . MDD (major depressive disorder) [F32.9] 03/01/2017  . SVD (spontaneous vaginal delivery) [O80] 10/28/2012  . Normal pregnancy, repeat [Z34.80] 10/27/2012   History of Present Illness:  32 y.o AAF single, lives wit his mother and two children. Works part time at [employer], first Occupational psychologist. Background history of Panic disorder but has not been treated. Presented to the ER via EMS. Had repeated panic attacks while at [employer]. Patient has had multiple deaths in her family. Her father committed suicide when she was 22 years of age. She later lost her step father and her mother's fiancee. A year ago, her kids father was killed.  Routine labs are within normal limits. Toxicology is negative. No substances.   Patient reports long history of depression. Says she and her children has been in counseling since her kids father passed last July 03, 2022. He died from motorcycle accident. Patient says she been feeling very depressed.worse in the past couple of weeks. No motivation to do things. She is withdrawn and not able to enjoy activities with her children. She struggles to keep up with task at work and at home. She tends to binge as a way of coping. Says she has deliberately lost over 20 pounds.Says getting into sleep has been an issue. Multiple awakening and early morning awakening. She struggles to get out of bed. Speed of processing things has slowed down.  Panic attacks has been incapacitating lately. Says she gets over six attacks in a week. Recent one was multiple attacks in the same day. Patient says she worries something might happen to her. She is concerned about her kids. Says she has never had  any suicidal thoughts. No associated psychosis. No evidence of mania. No evidence of PTSD. No substance use. No thoughts of harming others. No thoughts of violence. No access to weapons.  Denies any stressors at home. No financial constraints. No relational difficulties. No legal issues.  She wants to get better and be back to her routines.   Total Time spent with patient: 1 hour  Past Psychiatric History: Depression and panic disorder. Never been on medication in the past. No past inpatient care. No past history of chemical dependency treatment.  No past history of mania. No past history of psychosis. No past history of suicidal behavior. No past history of violent behavior. She is in therapy.      Is the patient at risk to self? No.  Has the patient been a risk to self in the past 6 months? No.  Has the patient been a risk to self within the distant past? No.  Is the patient a risk to others? No.  Has the patient been a risk to others in the past 6 months? No.  Has the patient been a risk to others within the distant past? No.   Prior Inpatient Therapy:   Prior Outpatient Therapy:    Alcohol Screening: 1. How often do you have a drink containing alcohol?: Never 2. How many drinks containing alcohol do you have on  a typical day when you are drinking?: 1 or 2 3. How often do you have six or more drinks on one occasion?: Never AUDIT-C Score: 0 9. Have you or someone else been injured as a result of your drinking?: No 10. Has a relative or friend or a doctor or another health worker been concerned about your drinking or suggested you cut down?: No Alcohol Use Disorder Identification Test Final Score (AUDIT): 0 Intervention/Follow-up: AUDIT Score <7 follow-up not indicated Substance Abuse History in the last 12 months:  No. Consequences of Substance Abuse: NA Previous Psychotropic Medications: No  Psychological Evaluations: Yes  Past Medical History:  Past Medical History:  Diagnosis  Date  . Headache(784.0)   . No pertinent past medical history   . Normal pregnancy, first 03/29/2011  . Normal pregnancy, repeat 10/27/2012  . SVD (spontaneous vaginal delivery) 03/30/2011  . SVD (spontaneous vaginal delivery) 10/28/2012    Past Surgical History:  Procedure Laterality Date  . I&D EXTREMITY     boil on back  . NO PAST SURGERIES     Family History:  Family History  Problem Relation Age of Onset  . Hypertension Mother   . Hypertension Sister   . Mental retardation Brother   . Mental illness Brother        schizophrenia  . Heart disease Paternal Grandmother   . Drug abuse Paternal Grandfather   . Hypertension Paternal Grandfather   . Alcohol abuse Paternal Uncle   . Anesthesia problems Neg Hx    Family Psychiatric  History: 60 brother and niece suffer from schizophrenia. Her father committed suicide.  Tobacco Screening: Have you used any form of tobacco in the last 30 days? (Cigarettes, Smokeless Tobacco, Cigars, and/or Pipes): No Social History:  Social History   Substance and Sexual Activity  Alcohol Use No     Social History   Substance and Sexual Activity  Drug Use No    Additional Social History:      Allergies:  No Known Allergies Lab Results:  Results for orders placed or performed during the hospital encounter of 02/28/17 (from the past 48 hour(s))  CBC with Differential     Status: None   Collection Time: 02/28/17  4:01 PM  Result Value Ref Range   WBC 6.9 4.0 - 10.5 K/uL   RBC 4.63 3.87 - 5.11 MIL/uL   Hemoglobin 14.2 12.0 - 15.0 g/dL   HCT 42.4 36.0 - 46.0 %   MCV 91.6 78.0 - 100.0 fL   MCH 30.7 26.0 - 34.0 pg   MCHC 33.5 30.0 - 36.0 g/dL   RDW 12.9 11.5 - 15.5 %   Platelets 294 150 - 400 K/uL   Neutrophils Relative % 61 %   Neutro Abs 4.2 1.7 - 7.7 K/uL   Lymphocytes Relative 27 %   Lymphs Abs 1.9 0.7 - 4.0 K/uL   Monocytes Relative 10 %   Monocytes Absolute 0.7 0.1 - 1.0 K/uL   Eosinophils Relative 2 %   Eosinophils Absolute  0.1 0.0 - 0.7 K/uL   Basophils Relative 0 %   Basophils Absolute 0.0 0.0 - 0.1 K/uL    Comment: Performed at [facility], [place] [address] [place], [place] [address]  Basic metabolic panel     Status: None   Collection Time: 02/28/17  4:01 PM  Result Value Ref Range   Sodium 141 135 - 145 mmol/L   Potassium 3.7 3.5 - 5.1 mmol/L   Chloride 107 101 - 111  mmol/L   CO2 24 22 - 32 mmol/L   Glucose, Bld 99 65 - 99 mg/dL   BUN 12 6 - 20 mg/dL   Creatinine, Ser 0.80 0.44 - 1.00 mg/dL   Calcium 9.5 8.9 - 10.3 mg/dL   GFR calc non Af Amer >60 >60 mL/min   GFR calc Af Amer >60 >60 mL/min    Comment: (NOTE) The eGFR has been calculated using the CKD EPI equation. This calculation has not been validated in all clinical situations. eGFR's persistently <60 mL/min signify possible Chronic Kidney Disease.    Anion gap 10 5 - 15    Comment: Performed at [facility], [place] [address], [place], [place] [address]  Ethanol     Status: None   Collection Time: 02/28/17  4:02 PM  Result Value Ref Range   Alcohol, Ethyl (B) <10 <10 mg/dL    Comment:        LOWEST DETECTABLE LIMIT FOR SERUM ALCOHOL IS 10 mg/dL FOR MEDICAL PURPOSES ONLY Performed at [place] [address] [place], [place] [address]   Salicylate level     Status: None   Collection Time: 02/28/17  4:02 PM  Result Value Ref Range   Salicylate Lvl <2.1 2.8 - 30.0 mg/dL    Comment: Performed at [facility], [place] [address], [place], [place] [address]  Acetaminophen level     Status: Abnormal   Collection Time: 02/28/17  4:02 PM  Result Value Ref Range   Acetaminophen (Tylenol), Serum <10 (L) 10 - 30 ug/mL    Comment:        THERAPEUTIC CONCENTRATIONS VARY SIGNIFICANTLY. A RANGE OF 10-30 ug/mL MAY BE AN EFFECTIVE CONCENTRATION FOR MANY PATIENTS. HOWEVER, SOME ARE BEST TREATED AT CONCENTRATIONS OUTSIDE THIS RANGE. ACETAMINOPHEN CONCENTRATIONS >150  ug/mL AT 4 HOURS AFTER INGESTION AND >50 ug/mL AT 12 HOURS AFTER INGESTION ARE OFTEN ASSOCIATED WITH TOXIC REACTIONS. Performed at [facility], [place] [address] [place], [place] [address]   I-Stat beta hCG blood, ED     Status: None   Collection Time: 02/28/17  5:23 PM  Result Value Ref Range   I-stat hCG, quantitative <5.0 <5 mIU/mL   Comment 3            Comment:   GEST. AGE      CONC.  (mIU/mL)   <=1 WEEK        5 - 50     2 WEEKS       50 - 500     3 WEEKS       100 - 10,000     4 WEEKS     1,000 - 30,000        FEMALE AND NON-PREGNANT FEMALE:     LESS THAN 5 mIU/mL   Rapid urine drug screen (hospital performed)     Status: None   Collection Time: 02/28/17  6:25 PM  Result Value Ref Range   Opiates NONE DETECTED NONE DETECTED   Cocaine NONE DETECTED NONE DETECTED   Benzodiazepines NONE DETECTED NONE DETECTED   Amphetamines NONE DETECTED NONE DETECTED   Tetrahydrocannabinol NONE DETECTED NONE DETECTED   Barbiturates NONE DETECTED NONE DETECTED    Comment: (NOTE) DRUG SCREEN FOR MEDICAL PURPOSES ONLY.  IF CONFIRMATION IS NEEDED FOR ANY PURPOSE, NOTIFY LAB WITHIN 5 DAYS. LOWEST DETECTABLE LIMITS FOR URINE DRUG SCREEN Drug Class  Cutoff (ng/mL) Amphetamine and metabolites    1000 Barbiturate and metabolites    200 Benzodiazepine                 400 Tricyclics and metabolites     300 Opiates and metabolites        300 Cocaine and metabolites        300 THC                            50 Performed at [facility], [place] [address] [place], [place] [address]     Blood Alcohol level:  Lab Results  Component Value Date   ETH <10 95/09/3265    Metabolic Disorder Labs:  No results found for: HGBA1C, MPG No results found for: PROLACTIN No results found for: CHOL, TRIG, HDL, CHOLHDL, VLDL, LDLCALC  Current Medications: Current Facility-Administered Medications  Medication Dose Route Frequency Provider Last Rate  Last Dose  . acetaminophen (TYLENOL) tablet 650 mg  650 mg Oral Q6H PRN Niel Hummer, NP      . alum & mag hydroxide-simeth (MAALOX/MYLANTA) 200-200-20 MG/5ML suspension 30 mL  30 mL Oral Q4H PRN Elmarie Shiley A, NP      . magnesium hydroxide (MILK OF MAGNESIA) suspension 30 mL  30 mL Oral Daily PRN Niel Hummer, NP      . traZODone (DESYREL) tablet 50 mg  50 mg Oral QHS PRN Niel Hummer, NP       PTA Medications: Medications Prior to Admission  Medication Sig Dispense Refill Last Dose  . acetaminophen (TYLENOL) 500 MG tablet Take 1,000 mg by mouth every 6 (six) hours as needed. Head aches   Past Week at Unknown time  . ibuprofen (ADVIL,MOTRIN) 600 MG tablet Take 1 tablet (600 mg total) by mouth every 6 (six) hours as needed. (Patient not taking: Reported on 02/28/2017) 30 tablet 0 Not Taking at Unknown time  . ibuprofen (ADVIL,MOTRIN) 800 MG tablet Take 1 tablet (800 mg total) by mouth every 8 (eight) hours as needed for pain. (Patient not taking: Reported on 02/28/2017) 45 tablet 1 Not Taking at Unknown time  . measles, mumps and rubella vaccine (MMR) injection Inject 0.5 mLs into the skin once. (Patient not taking: Reported on 02/28/2017) 1 each 0 Not Taking at Unknown time  . methocarbamol (ROBAXIN) 500 MG tablet Take 1 tablet (500 mg total) by mouth 2 (two) times daily. (Patient not taking: Reported on 02/28/2017) 20 tablet 0 Not Taking at Unknown time  . oxyCODONE-acetaminophen (PERCOCET) 10-325 MG per tablet Take 1 tablet by mouth every 6 (six) hours as needed for pain. 15 tablet 0   . predniSONE (DELTASONE) 20 MG tablet 3 tabs po day one, then 2 po daily x 4 days (Patient not taking: Reported on 02/28/2017) 11 tablet 0 Not Taking at Unknown time  . Prenatal Vit-Fe Fumarate-FA (PRENATAL COMPLETE) 14-0.4 MG TABS Take 1 tablet by mouth 1 day or 1 dose. (Patient not taking: Reported on 02/28/2017) 60 each 3 Not Taking at Unknown time    Musculoskeletal: Strength & Muscle Tone: within normal  limits Gait & Station: normal Patient leans: N/A  Psychiatric Specialty Exam: Physical Exam  Constitutional: She is oriented to person, place, and time. She appears well-developed and well-nourished.  HENT:  Head: Normocephalic and atraumatic.  Respiratory: Effort normal and breath sounds normal.  Neurological: She is alert and oriented to person, place, and time.  Psychiatric:  As above    ROS  Blood pressure 120/66, pulse (!) 101, temperature 98.6 F (37 C), temperature source Oral, resp. rate 18, height _0  (1.702 m), weight 91.6 kg (202 lb).Body mass index is 31.64 kg/m.  General Appearance: In hospital clothing, withdrawn, moderate engagement  Eye Contact:  Fair  Speech:  Decreased rate and tone  Volume:  Decreased  Mood:  Depressed  Affect:  Blunted and mood congruent  Thought Process:  Decreased speed of thought. Linear and goal directed.   Orientation:  Full (Time, Place, and Person)  Thought Content:  Rumination, anticipatory anxiety. No delusional theme. No preoccupation with violent thoughts. No negative ruminations. No obsession.  No hallucination in any modality.   Suicidal Thoughts:  No  Homicidal Thoughts:  No  Memory:  Immediate;   Good Recent;   Good Remote;   Good  Judgement:  Good  Insight:  Good  Psychomotor Activity:  Decreased  Concentration:  Concentration: Fair and Attention Span: Fair  Recall:  Good  Fund of Knowledge:  Good  Language:  Good  Akathisia:  Negative  Handed:    AIMS (if indicated):     Assets:  Communication Skills Desire for Improvement Financial Resources/Insurance Housing Physical Health Resilience Talents/Skills Transportation Vocational/Educational  ADL's:  Fair  Cognition:  WNL  Sleep:  Number of Hours: 5.75    Treatment Plan Summary: Patient is presenting with features of unipolar depression associated with panic attacks. We discussed use of Sertraline. She consented to treatment after we reviewed the risks and  benefits. She also consented to use of Trazodone for insomnia and low dose Hydroxyzine  on PRN basis. We plan to gather collateral and evaluate her further.   Psychiatric: MDD Panic Disorder  Medical:  Psychosocial:  Bereavement  PLAN: 1. Sertraline 25 mg daily. Would titrate as tolerated/needed 2. Encourage unit groups and therapeutic activities 3. Monitor mood, behavior and interaction with peers 4. SW would gather collateral from her family and coordinate aftercare   Observation Level/Precautions:    Laboratory:    Psychotherapy:    Medications:    Consultations:    Discharge Concerns:    Estimated LOS:  Other:     Physician Treatment Plan for Primary Diagnosis: <principal problem not specified> Long Term Goal(s): Improvement in symptoms so as ready for discharge  Short Term Goals: Ability to identify changes in lifestyle to reduce recurrence of condition will improve, Ability to verbalize feelings will improve, Ability to disclose and discuss suicidal ideas, Ability to demonstrate self-control will improve, Ability to identify and develop effective coping behaviors will improve, Ability to maintain clinical measurements within normal limits will improve and Compliance with prescribed medications will improve  Physician Treatment Plan for Secondary Diagnosis: Active Problems:   MDD (major depressive disorder)  Long Term Goal(s): Improvement in symptoms so as ready for discharge  Short Term Goals: Ability to identify changes in lifestyle to reduce recurrence of condition will improve, Ability to verbalize feelings will improve, Ability to disclose and discuss suicidal ideas, Ability to demonstrate self-control will improve, Ability to identify and develop effective coping behaviors will improve, Ability to maintain clinical measurements within normal limits will improve and Compliance with prescribed medications will improve  I certify that inpatient services furnished can  reasonably be expected to improve the patient's condition.    [place], MD 2/26/20197:54 AM

## 2017-03-01 NOTE — Progress Notes (Signed)
Patient ID: Dawn Velez, female   DOB: [birth year], 32 y.o.   MRN: [id number]  DAR: Pt. Denies SI/HI and A/V Hallucinations. She reports that her sleep last night was fair, her appetite is fair, her energy level is low, and her concentration is poor. She rates her depression level 5/10, her hopelessness level is 4/10, and anxiety 7/10. PRN Vistaril was administered to patient for her anxiety which provided relief. Patient does report a headache and received PRN medication for this. Support and encouragement provided to the patient. Scheduled medications administered to patient per physician's orders. Patient is minimal but cooperative. Q15 minute checks are maintained for safety.

## 2017-03-01 NOTE — Plan of Care (Signed)
  Progressing Self-Concept: Level of anxiety will decrease 03/01/2017 1802 - Progressing by Lenord Fellersopson, Katoya Amato Elizabeth, RN

## 2017-03-01 NOTE — BHH Group Notes (Signed)
LCSW Group Therapy Note 03/01/2017 12:44 PM  Type of Therapy/Topic: Group Therapy: Feelings about Diagnosis  Participation Level: Active   Description of Group:  This group will allow patients to explore their thoughts and feelings about diagnoses they have received. Patients will be guided to explore their level of understanding and acceptance of these diagnoses. Facilitator will encourage patients to process their thoughts and feelings about the reactions of others to their diagnosis and will guide patients in identifying ways to discuss their diagnosis with significant others in their lives. This group will be process-oriented, with patients participating in exploration of their own experiences, giving and receiving support, and processing challenge from other group members.  Therapeutic Goals: 1. Patient will demonstrate understanding of diagnosis as evidenced by identifying two or more symptoms of the disorder 2. Patient will be able to express two feelings regarding the diagnosis 3. Patient will demonstrate their ability to communicate their needs through discussion and/or role play  Summary of Patient Progress: [place] was engaged throughout the group session. She participated and contributed to the group's discussion. [place] shared with the group he experience with learning she had a mental health diagnosis. She reports that initially she was afraid because she did not know what to expect. She states that she is processing her mental health and what that means for her children in the future. [place]any also reports that her mental health diagnosis provided answers for the many symptoms she had experienced over the years. She states that she always wondered why she felt emotionally disconnected from her children, and reports she now understands why. [place]Br[place]any reports planning on educating her loved ones about her mental health diagnosis, in order to strengthen her support system network.      Therapeutic Modalities:  Cognitive Behavioral Therapy Brief Therapy Feelings Identification    Ettamae Barkett Catalina AntiguaWilliams LCSWA Clinical Social Worker
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
## 2017-03-02 DIAGNOSIS — G4700 Insomnia, unspecified: Secondary | ICD-10-CM

## 2017-03-02 DIAGNOSIS — R450 Nervousness: Secondary | ICD-10-CM

## 2017-03-02 DIAGNOSIS — F419 Anxiety disorder, unspecified: Secondary | ICD-10-CM

## 2017-03-02 DIAGNOSIS — Z87891 Personal history of nicotine dependence: Secondary | ICD-10-CM

## 2017-03-02 MED ORDER — SERTRALINE HCL 25 MG PO TABS
25.0000 mg | ORAL_TABLET | Freq: Once | ORAL | Status: AC
  Administered 2017-03-02: 25 mg via ORAL
  Filled 2017-03-02: qty 1

## 2017-03-02 MED ORDER — SERTRALINE HCL 50 MG PO TABS
50.0000 mg | ORAL_TABLET | Freq: Every day | ORAL | Status: DC
  Administered 2017-03-03 – 2017-03-04 (×2): 50 mg via ORAL
  Filled 2017-03-02 (×2): qty 1
  Filled 2017-03-02: qty 7
  Filled 2017-03-02: qty 1

## 2017-03-02 NOTE — [facility] Group Notes (Addendum)
[facility] Group Therapy 03/02/2017 9:00am  Type of Therapy: Mental Health Association Presentation  Participation Level: Active  Participation Quality: Attentive  Affect: Appropriate  Cognitive: Oriented  Insight: Developing/Improving  Engagement in Therapy: Engaged  Modes of Intervention: Discussion, Education and Socialization  Summary of Progress/Problems: Mental Health Association (MHA) Speaker came to talk about his personal journey with mental health. The pt processed ways by which to relate to the speaker. MHA speaker provided handouts and educational information pertaining to groups and services offered by the [facility]. Pt was engaged in speaker's presentation and was receptive to resources provided.    Lorri FrederickWierda, Jemya Depierro Jon, LCSW 03/02/2017 12:44 PM

## 2017-03-02 NOTE — Progress Notes (Signed)
Adult Psychoeducational Group Note  Date:  03/02/2017 Time:  9:16 AM  Group Topic/Focus:  Goals Group:   The focus of this group is to help patients establish daily goals to achieve during treatment and discuss how the patient can incorporate goal setting into their daily lives to aide in recovery.  Participation Level:  Minimal  Participation Quality:  Appropriate  Affect:  Appropriate  Cognitive:  Alert  Insight: Limited  Engagement in Group:  Limited  Modes of Intervention:  Discussion and Education  Additional Comments:  Pt was present for the morning group but did not participate much.  Lavern Maslow E 03/02/2017, 9:16 AM

## 2017-03-02 NOTE — Progress Notes (Signed)
Recreation Therapy Notes  Date: 03/02/17 Time: 0930 Location: 300 Hall Dayroom  Group Topic: Stress Management  Goal Area(s) Addresses:  Patient will verbalize importance of using healthy stress management.  Patient will identify positive emotions associated with healthy stress management.   Intervention: Stress Management  Activity :  Progressive Muscle Relaxation.  LRT introduced the stress management technique of progressive muscle relaxation.  LRT read a script to guide the patients through the process of tensing and relaxing each muscle group individually.    Education:  Stress Management, Discharge Planning.   Education Outcome: Acknowledges edcuation/In group clarification offered/Needs additional education  Clinical Observations/Feedback: Pt did not attend group.     Dawn Velez, LRT/CTRS         Dawn Velez A 03/02/2017 11:59 AM 

## 2017-03-02 NOTE — [facility] Suicide Risk Assessment (Signed)
[facility] INPATIENT:  Family/Significant Other Suicide Prevention Education  Suicide Prevention Education:  Education Completed; Dawn Velez (mother, [phone]) has been identified by the patient as the family member/significant other with whom the patient will be residing, and identified as the person(s) who will aid the patient in the event of a mental health crisis (suicidal ideations/suicide attempt).  With written consent from the patient, the family member/significant other has been provided the following suicide prevention education, prior to the and/or following the discharge of the patient.  The suicide prevention education provided includes the following:  Suicide risk factors  Suicide prevention and interventions  National Suicide Hotline telephone number  [facility] assessment telephone number  [facility] Emergency Assistance 911  [facility] and/or Residential Mobile Crisis Unit telephone number  Request made of family/significant other to:  Remove weapons (e.g., guns, rifles, knives), all items previously/currently identified as safety concern.    Remove drugs/medications (over-the-counter, prescriptions, illicit drugs), all items previously/currently identified as a safety concern.  The family member/significant other verbalizes understanding of the suicide prevention education information provided.  The family member/significant other agrees to remove the items of safety concern listed above.  Dawn Velez 03/02/2017, 10:36 AM

## 2017-03-02 NOTE — Progress Notes (Signed)
Pt attend wrap up group. Her day was a 6. Her goal work in her anxiety and different situation. She need to talk to other patient interaction to help her freely talk. Pt complain she was not able to go outside and get fresh air. She do not understand what the big deal why this happen. Pt said the nurse doctor and therapist need to be professional and they were not. Pt feel being here is a waste of her time and nothing was accomplished today. RN notify and she was able to help come up with a solution.

## 2017-03-02 NOTE — Progress Notes (Signed)
D    Pt is pleasant on approach and brightens on approach   She interacts well with others and attends groups    She reports frustration over some things that went on on the unit today and expresses desire to participate in making a list of suggestions regarding [facility] and what she feels like she needs to get better A   Verbal support and theraputic listening given to patient    Medications administered and effectiveness monitored   Q 15 min checks  R   Pt is safe at present and reports feeling some better after staff discussed frustration with her

## 2017-03-02 NOTE — Progress Notes (Signed)
Patient ID: Dawn Velez, female   DOB: [birth year], 32 y.o.   MRN: [id number]  DAR: Pt. presents with depressed affect and mood. She denies SI/HI and A/V Hallucinations. She reports that her sleep last night is good, her appetite is good, her energy level is low, and her concentration is good. She rates her depression level 6-7/10, hopelessness level is 3/10, and her anxiety level is 6-7/10. She received PRN Vistaril for her anxiety which provided some relief. Patient does not report any pain or discomfort at this time. Support and encouragement provided to the patient. Scheduled medication administered to patient per physician's orders. Patient is minimal with Clinical research associatewriter but cooperative. She is seen in the milieu intermittently. Q15 minute checks are maintained for safety.

## 2017-03-02 NOTE — Progress Notes (Signed)
D   Pt depressed and sad    Visible on the milieu but minimal interaction   Her behavior is appropriate    Pt reports sleeping well last night and wanted the same medications as she had last night, tonight  A   Verbal support given   Medications administered and effectiveness monitored    Q 15 min checks  R   Pt is safe at present time and receptive to verbal support

## 2017-03-02 NOTE — Progress Notes (Signed)
Adult Psychoeducational Group Note  Date:  03/02/2017 Time: 1400 Group Topic/Focus:  Coping With Mental Health Crisis:   The purpose of this group is to help patients identify strategies for coping with mental health crisis.  Group discusses possible causes of crisis and ways to manage them effectively.  Participation Level:  Active  Participation Quality:  Appropriate  Affect:  Appropriate  Cognitive:  Appropriate  Insight: Appropriate  Engagement in Group:  Engaged  Modes of Intervention:  Discussion  Additional Comments:    Bryar Rennie L 03/02/2017,   

## 2017-03-02 NOTE — [facility] Group Notes (Signed)
[facility] Group Therapy 03/02/2017 1:15pm  Type of Therapy: Mental Health Association Presentation  Participation Level: Active  Participation Quality: Attentive  Affect: Appropriate  Cognitive: Oriented  Insight: Developing/Improving  Engagement in Therapy: Engaged  Modes of Intervention: Discussion, Education and Socialization  Summary of Progress/Problems: Mental Health Association (MHA) Speaker came to talk about his personal journey with mental health. The pt processed ways by which to relate to the speaker. MHA speaker provided handouts and educational information pertaining to groups and services offered by the [facility]. Pt was engaged in speaker's presentation and was receptive to resources provided.    Pulte HomesHeather N Smart, LCSW 03/02/2017 9:29 AM

## 2017-03-02 NOTE — Progress Notes (Signed)
[facility] MD Progress Note  03/02/2017 1:36 PM Dawn Velez  MRN:  [id number]   Subjective:  Patient reports that she is still feeling very depressed and drained. She rates her depression at 7/10 and her anxiety at 5/10. She reports that her life stressors of children, school, and work are not going to go away and she is worried about "getting smacked in the face by them" when she leaves. She reports that she slept good and has a good appetite. Her mom lives with her and her mom is very supportive. She denies any SI/HI/AVH and contracts for safety.    Objective: Patient's chart and findings reviewed and discussed with treatment team. Patient presents in her room and is pleasant and cooperative. She has a flat affect and appears depressed. She has been seen in the day room interacting appropriately. She has attended groups as well. She will have her Zoloft to 50 mg Daily and continue all other medications.   Principal Problem: MDD (major depressive disorder), severe ([place]) Diagnosis:   Patient Active Problem List   Diagnosis Date Noted  . MDD (major depressive disorder), severe ([place]) [F32.2] 03/01/2017  . Panic disorder [F41.0] 03/01/2017  . SVD (spontaneous vaginal delivery) [O80] 10/28/2012  . Normal pregnancy, repeat [Z34.80] 10/27/2012   Total Time spent with patient: 30 minutes  Past Psychiatric History: See H&P  Past Medical History:  Past Medical History:  Diagnosis Date  . Headache(784.0)   . No pertinent past medical history   . Normal pregnancy, first 03/29/2011  . Normal pregnancy, repeat 10/27/2012  . SVD (spontaneous vaginal delivery) 03/30/2011  . SVD (spontaneous vaginal delivery) 10/28/2012    Past Surgical History:  Procedure Laterality Date  . I&D EXTREMITY     boil on back  . NO PAST SURGERIES     Family History:  Family History  Problem Relation Age of Onset  . Hypertension Mother   . Hypertension Sister   . Mental retardation Brother   . Mental illness  Brother        schizophrenia  . Heart disease Paternal Grandmother   . Drug abuse Paternal Grandfather   . Hypertension Paternal Grandfather   . Alcohol abuse Paternal Uncle   . Anesthesia problems Neg Hx    Family Psychiatric  History: See H&P Social History:  Social History   Substance and Sexual Activity  Alcohol Use No     Social History   Substance and Sexual Activity  Drug Use No    Social History   Socioeconomic History  . Marital status: Single    Spouse name: None  . Number of children: None  . Years of education: None  . Highest education level: None  Social Needs  . Financial resource strain: None  . Food insecurity - worry: None  . Food insecurity - inability: None  . Transportation needs - medical: None  . Transportation needs - non-medical: None  Occupational History  . None  Tobacco Use  . Smoking status: Former Smoker    Packs/day: 1.00    Years: 1.00    Pack years: 1.00    Types: Cigarettes    Last attempt to quit: 03/30/2010    Years since quitting: 6.9  . Smokeless tobacco: Never Used  Substance and Sexual Activity  . Alcohol use: No  . Drug use: No  . Sexual activity: Yes    Birth control/protection: None  Other Topics Concern  . None  Social History Narrative  . None  Additional Social History:                         Sleep: Good  Appetite:  Good  Current Medications: Current Facility-Administered Medications  Medication Dose Route Frequency Provider Last Rate Last Dose  . acetaminophen (TYLENOL) tablet 650 mg  650 mg Oral Q6H PRN Elmarie Shiley A, NP   650 mg at 03/01/17 0943  . alum & mag hydroxide-simeth (MAALOX/MYLANTA) 200-200-20 MG/5ML suspension 30 mL  30 mL Oral Q4H PRN Elmarie Shiley A, NP      . hydrOXYzine (ATARAX/VISTARIL) tablet 25 mg  25 mg Oral Q6H PRN Lindell Spar I, NP   25 mg at 03/02/17 0751  . magnesium hydroxide (MILK OF MAGNESIA) suspension 30 mL  30 mL Oral Daily PRN Niel Hummer, NP      . Derrill Memo  ON 03/03/2017] sertraline (ZOLOFT) tablet 50 mg  50 mg Oral Daily Money, Lowry Ram, FNP      . traZODone (DESYREL) tablet 50 mg  50 mg Oral QHS PRN Niel Hummer, NP   50 mg at 03/01/17 2229    Lab Results:  Results for orders placed or performed during the hospital encounter of 02/28/17 (from the past 48 hour(s))  CBC with Differential     Status: None   Collection Time: 02/28/17  4:01 PM  Result Value Ref Range   WBC 6.9 4.0 - 10.5 K/uL   RBC 4.63 3.87 - 5.11 MIL/uL   Hemoglobin 14.2 12.0 - 15.0 g/dL   HCT 42.4 36.0 - 46.0 %   MCV 91.6 78.0 - 100.0 fL   MCH 30.7 26.0 - 34.0 pg   MCHC 33.5 30.0 - 36.0 g/dL   RDW 12.9 11.5 - 15.5 %   Platelets 294 150 - 400 K/uL   Neutrophils Relative % 61 %   Neutro Abs 4.2 1.7 - 7.7 K/uL   Lymphocytes Relative 27 %   Lymphs Abs 1.9 0.7 - 4.0 K/uL   Monocytes Relative 10 %   Monocytes Absolute 0.7 0.1 - 1.0 K/uL   Eosinophils Relative 2 %   Eosinophils Absolute 0.1 0.0 - 0.7 K/uL   Basophils Relative 0 %   Basophils Absolute 0.0 0.0 - 0.1 K/uL    Comment: Performed at [facility], [place] [address] [place], [place] [address]  Basic metabolic panel     Status: None   Collection Time: 02/28/17  4:01 PM  Result Value Ref Range   Sodium 141 135 - 145 mmol/L   Potassium 3.7 3.5 - 5.1 mmol/L   Chloride 107 101 - 111 mmol/L   CO2 24 22 - 32 mmol/L   Glucose, Bld 99 65 - 99 mg/dL   BUN 12 6 - 20 mg/dL   Creatinine, Ser 0.80 0.44 - 1.00 mg/dL   Calcium 9.5 8.9 - 10.3 mg/dL   GFR calc non Af Amer >60 >60 mL/min   GFR calc Af Amer >60 >60 mL/min    Comment: (NOTE) The eGFR has been calculated using the CKD EPI equation. This calculation has not been validated in all clinical situations. eGFR's persistently <60 mL/min signify possible Chronic Kidney Disease.    Anion gap 10 5 - 15    Comment: Performed at [facility], [place] [address] [place], [place] [address]  Ethanol     Status: None   Collection Time:  02/28/17  4:02 PM  Result Value Ref Range   Alcohol, Ethyl (  B) <10 <10 mg/dL    Comment:        LOWEST DETECTABLE LIMIT FOR SERUM ALCOHOL IS 10 mg/dL FOR MEDICAL PURPOSES ONLY Performed at [facility] [address], [place], [place] [address]   Salicylate level     Status: None   Collection Time: 02/28/17  4:02 PM  Result Value Ref Range   Salicylate Lvl <0.3 2.8 - 30.0 mg/dL    Comment: Performed at [facility], [place] [address], [place], [place] [address]  Acetaminophen level     Status: Abnormal   Collection Time: 02/28/17  4:02 PM  Result Value Ref Range   Acetaminophen (Tylenol), Serum <10 (L) 10 - 30 ug/mL    Comment:        THERAPEUTIC CONCENTRATIONS VARY SIGNIFICANTLY. A RANGE OF 10-30 ug/mL MAY BE AN EFFECTIVE CONCENTRATION FOR MANY PATIENTS. HOWEVER, SOME ARE BEST TREATED AT CONCENTRATIONS OUTSIDE THIS RANGE. ACETAMINOPHEN CONCENTRATIONS >150 ug/mL AT 4 HOURS AFTER INGESTION AND >50 ug/mL AT 12 HOURS AFTER INGESTION ARE OFTEN ASSOCIATED WITH TOXIC REACTIONS. Performed at [facility], [place] [address], [place], [place] [address]   I-Stat beta hCG blood, ED     Status: None   Collection Time: 02/28/17  5:23 PM  Result Value Ref Range   I-stat hCG, quantitative <5.0 <5 mIU/mL   Comment 3            Comment:   GEST. AGE      CONC.  (mIU/mL)   <=1 WEEK        5 - 50     2 WEEKS       50 - 500     3 WEEKS       100 - 10,000     4 WEEKS     1,000 - 30,000        FEMALE AND NON-PREGNANT FEMALE:     LESS THAN 5 mIU/mL   Rapid urine drug screen (hospital performed)     Status: None   Collection Time: 02/28/17  6:25 PM  Result Value Ref Range   Opiates NONE DETECTED NONE DETECTED   Cocaine NONE DETECTED NONE DETECTED   Benzodiazepines NONE DETECTED NONE DETECTED   Amphetamines NONE DETECTED NONE DETECTED   Tetrahydrocannabinol NONE DETECTED NONE DETECTED   Barbiturates NONE DETECTED NONE DETECTED     Comment: (NOTE) DRUG SCREEN FOR MEDICAL PURPOSES ONLY.  IF CONFIRMATION IS NEEDED FOR ANY PURPOSE, NOTIFY LAB WITHIN 5 DAYS. LOWEST DETECTABLE LIMITS FOR URINE DRUG SCREEN Drug Class                     Cutoff (ng/mL) Amphetamine and metabolites    1000 Barbiturate and metabolites    200 Benzodiazepine                 170 Tricyclics and metabolites     300 Opiates and metabolites        300 Cocaine and metabolites        300 THC                            50 Performed at [facility], [place] [address], [place], [place] [address]     Blood Alcohol level:  Lab Results  Component Value Date   ETH <10 44/96/7591    Metabolic Disorder Labs: No results found for: HGBA1C, MPG No results found for: PROLACTIN  No results found for: CHOL, TRIG, HDL, CHOLHDL, VLDL, LDLCALC  Physical Findings: AIMS: Facial and Oral Movements Muscles of Facial Expression: None, normal Lips and Perioral Area: None, normal Jaw: None, normal Tongue: None, normal,Extremity Movements Upper (arms, wrists, hands, fingers): None, normal Lower (legs, knees, ankles, toes): None, normal, Trunk Movements Neck, shoulders, hips: None, normal, Overall Severity Severity of abnormal movements (highest score from questions above): None, normal Incapacitation due to abnormal movements: None, normal Patient's awareness of abnormal movements (rate only patient's report): No Awareness, Dental Status Current problems with teeth and/or dentures?: No Does patient usually wear dentures?: No  CIWA:    COWS:     Musculoskeletal: Strength & Muscle Tone: within normal limits Gait & Station: normal Patient leans: N/A  Psychiatric Specialty Exam: Physical Exam  Nursing note and vitals reviewed. Constitutional: She is oriented to person, place, and time. She appears well-developed and well-nourished.  Cardiovascular: Normal rate.  Respiratory: Effort normal.  Musculoskeletal: Normal range of motion.   Neurological: She is alert and oriented to person, place, and time.    Review of Systems  Constitutional: Negative.   HENT: Negative.   Eyes: Negative.   Respiratory: Negative.   Cardiovascular: Negative.   Gastrointestinal: Negative.   Genitourinary: Negative.   Musculoskeletal: Negative.   Skin: Negative.   Neurological: Negative.   Endo/Heme/Allergies: Negative.   Psychiatric/Behavioral: Positive for depression. Negative for hallucinations and suicidal ideas. The patient is nervous/anxious.     Blood pressure 124/71, pulse 74, temperature 98.3 F (36.8 C), temperature source Oral, resp. rate 17, height _0  (1.702 m), weight 91.6 kg (202 lb).Body mass index is 31.64 kg/m.  General Appearance: Casual  Eye Contact:  Good  Speech:  Clear and Coherent and Normal Rate  Volume:  Normal  Mood:  Depressed  Affect:  Depressed and Flat  Thought Process:  Goal Directed and Descriptions of Associations: Intact  Orientation:  Full (Time, Place, and Person)  Thought Content:  WDL  Suicidal Thoughts:  No  Homicidal Thoughts:  No  Memory:  Immediate;   Good Recent;   Good Remote;   Good  Judgement:  Good  Insight:  Good  Psychomotor Activity:  Normal  Concentration:  Concentration: Good and Attention Span: Good  Recall:  Good  Fund of Knowledge:  Good  Language:  Good  Akathisia:  No  Handed:  Right  AIMS (if indicated):     Assets:  Communication Skills Desire for Improvement Financial Resources/Insurance Housing Physical Health Social Support Transportation  ADL's:  Intact  Cognition:  WNL  Sleep:  Number of Hours: 6.25   Problems Addressed: MDD severe  Treatment Plan Summary: Daily contact with patient to assess and evaluate symptoms and progress in treatment, Medication management and Plan is to:  -Continue Vistaril 15 mg PO Q6H PRN for anxiety -Increase Zoloft 50 mg PO Daily for mood stability -Continue Trazodone 50 mg QHS PRN for insomnia -Encourage group  therapy participation  Lewis Shock, FNP 03/02/2017, 1:36 PM

## 2017-03-02 NOTE — Tx Team (Signed)
Interdisciplinary Treatment and Diagnostic Plan Update  03/02/2017 Time of Session: [place] MRN: [id number]  Principal Diagnosis: MDD (major depressive disorder), severe ([place])  Secondary Diagnoses: Principal Problem:   MDD (major depressive disorder), severe ([place]) Active Problems:   Panic disorder   Current Medications:  Current Facility-Administered Medications  Medication Dose Route Frequency Provider Last Rate Last Dose  . acetaminophen (TYLENOL) tablet 650 mg  650 mg Oral Q6H PRN Elmarie Shiley A, NP   650 mg at 03/01/17 0943  . alum & mag hydroxide-simeth (MAALOX/MYLANTA) 200-200-20 MG/5ML suspension 30 mL  30 mL Oral Q4H PRN Elmarie Shiley A, NP      . hydrOXYzine (ATARAX/VISTARIL) tablet 25 mg  25 mg Oral Q6H PRN Lindell Spar I, NP   25 mg at 03/02/17 0751  . magnesium hydroxide (MILK OF MAGNESIA) suspension 30 mL  30 mL Oral Daily PRN Niel Hummer, NP      . Derrill Memo ON 03/03/2017] sertraline (ZOLOFT) tablet 50 mg  50 mg Oral Daily Money, Travis B, FNP      . traZODone (DESYREL) tablet 50 mg  50 mg Oral QHS PRN Niel Hummer, NP   50 mg at 03/01/17 2229   PTA Medications: Medications Prior to Admission  Medication Sig Dispense Refill Last Dose  . acetaminophen (TYLENOL) 500 MG tablet Take 1,000 mg by mouth every 6 (six) hours as needed. Head aches   Past Week at Unknown time  . ibuprofen (ADVIL,MOTRIN) 600 MG tablet Take 1 tablet (600 mg total) by mouth every 6 (six) hours as needed. (Patient not taking: Reported on 02/28/2017) 30 tablet 0 Not Taking at Unknown time  . ibuprofen (ADVIL,MOTRIN) 800 MG tablet Take 1 tablet (800 mg total) by mouth every 8 (eight) hours as needed for pain. (Patient not taking: Reported on 02/28/2017) 45 tablet 1 Not Taking at Unknown time  . measles, mumps and rubella vaccine (MMR) injection Inject 0.5 mLs into the skin once. (Patient not taking: Reported on 02/28/2017) 1 each 0 Not Taking at Unknown time  . methocarbamol (ROBAXIN) 500 MG  tablet Take 1 tablet (500 mg total) by mouth 2 (two) times daily. (Patient not taking: Reported on 02/28/2017) 20 tablet 0 Not Taking at Unknown time  . oxyCODONE-acetaminophen (PERCOCET) 10-325 MG per tablet Take 1 tablet by mouth every 6 (six) hours as needed for pain. 15 tablet 0   . predniSONE (DELTASONE) 20 MG tablet 3 tabs po day one, then 2 po daily x 4 days (Patient not taking: Reported on 02/28/2017) 11 tablet 0 Not Taking at Unknown time  . Prenatal Vit-Fe Fumarate-FA (PRENATAL COMPLETE) 14-0.4 MG TABS Take 1 tablet by mouth 1 day or 1 dose. (Patient not taking: Reported on 02/28/2017) 60 each 3 Not Taking at Unknown time    Patient Stressors: Educational concerns Occupational concerns Traumatic event  Patient Strengths: Curator fund of knowledge Supportive family/friends  Treatment Modalities: Medication Management, Group therapy, Case management,  1 to 1 session with clinician, Psychoeducation, Recreational therapy.   Physician Treatment Plan for Primary Diagnosis: MDD (major depressive disorder), severe ([place]) Long Term Goal(s): Improvement in symptoms so as ready for discharge Improvement in symptoms so as ready for discharge   Short Term Goals: Ability to identify changes in lifestyle to reduce recurrence of condition will improve Ability to verbalize feelings will improve Ability to disclose and discuss suicidal ideas Ability to demonstrate self-control will improve Ability to identify and develop effective coping behaviors will improve Ability  to maintain clinical measurements within normal limits will improve Compliance with prescribed medications will improve Ability to identify changes in lifestyle to reduce recurrence of condition will improve Ability to verbalize feelings will improve Ability to disclose and discuss suicidal ideas Ability to demonstrate self-control will improve Ability to identify and develop effective coping behaviors will  improve Ability to maintain clinical measurements within normal limits will improve Compliance with prescribed medications will improve  Medication Management: Evaluate patient's response, side effects, and tolerance of medication regimen.  Therapeutic Interventions: 1 to 1 sessions, Unit Group sessions and Medication administration.  Evaluation of Outcomes: Progressing  Physician Treatment Plan for Secondary Diagnosis: Principal Problem:   MDD (major depressive disorder), severe ([place]) Active Problems:   Panic disorder  Long Term Goal(s): Improvement in symptoms so as ready for discharge Improvement in symptoms so as ready for discharge   Short Term Goals: Ability to identify changes in lifestyle to reduce recurrence of condition will improve Ability to verbalize feelings will improve Ability to disclose and discuss suicidal ideas Ability to demonstrate self-control will improve Ability to identify and develop effective coping behaviors will improve Ability to maintain clinical measurements within normal limits will improve Compliance with prescribed medications will improve Ability to identify changes in lifestyle to reduce recurrence of condition will improve Ability to verbalize feelings will improve Ability to disclose and discuss suicidal ideas Ability to demonstrate self-control will improve Ability to identify and develop effective coping behaviors will improve Ability to maintain clinical measurements within normal limits will improve Compliance with prescribed medications will improve     Medication Management: Evaluate patient's response, side effects, and tolerance of medication regimen.  Therapeutic Interventions: 1 to 1 sessions, Unit Group sessions and Medication administration.  Evaluation of Outcomes: Progressing   RN Treatment Plan for Primary Diagnosis: MDD (major depressive disorder), severe ([place]) Long Term Goal(s): Knowledge of disease and therapeutic  regimen to maintain health will improve  Short Term Goals: Ability to identify and develop effective coping behaviors will improve and Compliance with prescribed medications will improve  Medication Management: RN will administer medications as ordered by provider, will assess and evaluate patient's response and provide education to patient for prescribed medication. RN will report any adverse and/or side effects to prescribing provider.  Therapeutic Interventions: 1 on 1 counseling sessions, Psychoeducation, Medication administration, Evaluate responses to treatment, Monitor vital signs and CBGs as ordered, Perform/monitor CIWA, COWS, AIMS and Fall Risk screenings as ordered, Perform wound care treatments as ordered.  Evaluation of Outcomes: Progressing   LCSW Treatment Plan for Primary Diagnosis: MDD (major depressive disorder), severe ([place]) Long Term Goal(s): Safe transition to appropriate next level of care at discharge, Engage patient in therapeutic group addressing interpersonal concerns.  Short Term Goals: Engage patient in aftercare planning with referrals and resources, Increase social support and Increase skills for wellness and recovery  Therapeutic Interventions: Assess for all discharge needs, 1 to 1 time with Social worker, Explore available resources and support systems, Assess for adequacy in community support network, Educate family and significant other(s) on suicide prevention, Complete Psychosocial Assessment, Interpersonal group therapy.  Evaluation of Outcomes: Progressing   Progress in Treatment: Attending groups: Yes. Participating in groups: Yes. Taking medication as prescribed: Yes. Toleration medication: Yes. Family/Significant other contact made: Yes, individual(s) contacted:  mother Patient understands diagnosis: Yes. Discussing patient identified problems/goals with staff: Yes. Medical problems stabilized or resolved: Yes. Denies suicidal/homicidal  ideation: Yes. Issues/concerns per patient self-inventory: No. Other: none  New problem(s) identified: No,  Describe:  none  New Short Term/Long Term Goal(s): Pt goal: to feel better, to get more aggressive treatment.  Discharge Plan or Barriers:   Reason for Continuation of Hospitalization: Anxiety Depression Medication stabilization  Estimated Length of Stay: 2-4 days.  Attendees: Patient:Dawn Velez 03/02/2017   Physician: Dr Sanjuana Letters, MD 03/02/2017   Nursing: Darrol Angel, RN 03/02/2017   RN Care Manager: 03/02/2017   Social Worker: Lurline Idol, LCSW 03/02/2017   Recreational Therapist:  03/02/2017   Other:  03/02/2017   Other:  03/02/2017   Other: 03/02/2017     Scribe for Treatment Team: Joanne Chars, [place] 03/02/2017 2:18 PM

## 2017-03-03 DIAGNOSIS — F332 Major depressive disorder, recurrent severe without psychotic features: Secondary | ICD-10-CM

## 2017-03-03 NOTE — [facility] Group Notes (Signed)
[facility] Group Notes:  Aromatherapy   Date:  03/03/2017  Time:  4:50 PM  Type of Therapy:  Psychoeducational Skills  Participation Level:  Active  Participation Quality:  Appropriate  Affect:  Depressed  Cognitive:  Appropriate  Insight:  Appropriate  Engagement in Group:  Engaged  Modes of Intervention:  Activity, Discussion and Education  Summary of Progress/Problems: Patient attended group.  Marzetta BoardDopson, Pasqual Farias E 03/03/2017, 4:50 PM

## 2017-03-03 NOTE — Progress Notes (Signed)
Adult Psychoeducational Group Note  Date:  03/03/2017 Time:  10:39 AM  Group Topic/Focus:  Crisis Planning:   The purpose of this group is to help patients create a crisis plan for use upon discharge or in the future, as needed.  Participation Level:  Active  Participation Quality:  Attentive  Affect:  Depressed and Flat  Cognitive:  Appropriate  Insight: Improving  Engagement in Group:  Developing/Improving  Modes of Intervention:  Discussion and Problem-solving   [place] Caterrika C Shalawn Wynder 03/03/2017, 10:39 AM

## 2017-03-03 NOTE — Progress Notes (Signed)
Pt attend wrap up group. Her day was a 7. Her goal deal with her anxiety here and when she leaves hete. She address this with her doctor but she need additional help. When she wakes up in morning not in a good mood and she dont like this feeling. She has so many unknown things going on that cause her anxiety.

## 2017-03-03 NOTE — Progress Notes (Signed)
D   Pt depressed and sad    Visible on the milieu but minimal interaction   Her behavior is appropriate    Pt reports sleeping well last night and wanted the same medications as she had last night, tonight  A   Verbal support given   Medications administered and effectiveness monitored    Q 15 min checks  R   Pt is safe at present time and receptive to verbal support

## 2017-03-03 NOTE — Progress Notes (Signed)
[facility] MD Progress Note  03/03/2017 1:12 PM Dawn Velez  MRN:  [id number]   Subjective:  Patient states that she feels much better today. She reports waking up and just was in a better mood. She slept better and has a better appetite today. She reports depression rated at 4/10 and her anxiety is at 1/10. She denies any medication side effects. She denies any SI/HI/AVH and contracts for safety.   Objective: Patient's chart and findings reviewed and discussed with treatment team. Patient is seen in the milieu laughing, smiling, interacting appropriately, and talking on the phone. She has been attending groups. She will continue current medications.    Principal Problem: MDD (major depressive disorder), severe ([facility]) Diagnosis:   Patient Active Problem List   Diagnosis Date Noted  . 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 MDD (major depressive disorder), severe ([facility]) [F32.2] 03/01/2017  . Panic disorder [F41.0] 03/01/2017  . SVD (spontaneous vaginal delivery) [O80] 10/28/2012  . Normal pregnancy, repeat [Z34.80] 10/27/2012   Total Time spent with patient: 15 minutes  Past Psychiatric History: See H&P  Past Medical History:  Past Medical History:  Diagnosis Date  . Headache(784.0)   . No pertinent past medical history   . Normal pregnancy, first 03/29/2011  . Normal pregnancy, repeat 10/27/2012  . SVD (spontaneous vaginal delivery) 03/30/2011  . SVD (spontaneous vaginal delivery) 10/28/2012    Past Surgical History:  Procedure Laterality Date  . I&D EXTREMITY     boil on back  . NO PAST SURGERIES     Family History:  Family History  Problem Relation Age of Onset  . Hypertension Mother   . Hypertension Sister   . Mental retardation Brother   . Mental illness Brother        schizophrenia  . Heart disease Paternal Grandmother   . Drug abuse Paternal Grandfather   . Hypertension Paternal Grandfather   . Alcohol  abuse Paternal Uncle   . Anesthesia problems Neg Hx    Family Psychiatric  History: See H&P Social History:  Social History   Substance and Sexual Activity  Alcohol Use No     Social History   Substance and Sexual Activity  Drug Use No    Social History   Socioeconomic History  . Marital status: Single    Spouse name: None  . Number of children: None  . Years of education: None  . Highest education level: None  Social Needs  . Financial resource strain: None  . Food insecurity - worry: None  . Food insecurity - inability: None  . Transportation needs - medical: None  . Transportation needs - non-medical: None  Occupational History  . None  Tobacco Use  . Smoking status: Former Smoker    Packs/day: 1.00    Years: 1.00    Pack years: 1.00    Types: Cigarettes    Last attempt to quit: 03/30/2010    Years since quitting: 6.9  . Smokeless tobacco: Never Used  Substance and Sexual Activity  . Alcohol use: No  . Drug use: No  . Sexual activity: Yes    Birth control/protection: None  Other Topics Concern  . None  Social History Narrative  . None   Additional Social History:                         Sleep: Good  Appetite:  Good  Current Medications: Current Facility-Administered  Medications  Medication Dose Route Frequency Provider Last Rate Last Dose  . acetaminophen (TYLENOL) tablet 650 mg  650 mg Oral Q6H PRN [name] A, NP   650 mg at 03/01/17 0943  . alum & mag hydroxide-simeth (MAALOX/MYLANTA) 200-200-20 MG/5ML suspension 30 mL  30 mL Oral Q4H PRN [name] A, NP      . hydrOXYzine (ATARAX/VISTARIL) tablet 25 mg  25 mg Oral Q6H PRN Armandina Stammer I, NP   25 mg at 03/03/17 0800  . magnesium hydroxide (MILK OF MAGNESIA) suspension 30 mL  30 mL Oral Daily PRN [name] A, NP      . sertraline (ZOLOFT) tablet 50 mg  50 mg Oral Daily Eldean Klatt B, FNP   50 mg at 03/03/17 0759  . traZODone (DESYREL) tablet 50 mg  50 mg Oral QHS PRN Thermon Leyland, NP   50 mg at 03/02/17 2144    Lab Results:  No results found for this or any previous visit (from the past 48 hour(s)).  Blood Alcohol level:  Lab Results  Component Value Date   ETH <10 02/28/2017    Metabolic Disorder Labs: No results found for: HGBA1C, MPG No results found for: PROLACTIN No results found for: CHOL, TRIG, HDL, CHOLHDL, VLDL, LDLCALC  Physical Findings: AIMS: Facial and Oral Movements Muscles of Facial Expression: None, normal Lips and Perioral Area: None, normal Jaw: None, normal Tongue: None, normal,Extremity Movements Upper (arms, wrists, hands, fingers): None, normal Lower (legs, knees, ankles, toes): None, normal, Trunk Movements Neck, shoulders, hips: None, normal, Overall Severity Severity of abnormal movements (highest score from questions above): None, normal Incapacitation due to abnormal movements: None, normal Patient's awareness of abnormal movements (rate only patient's report): No Awareness, Dental Status Current problems with teeth and/or dentures?: No Does patient usually wear dentures?: No  CIWA:    COWS:     Musculoskeletal: Strength & Muscle Tone: within normal limits Gait & Station: normal Patient leans: N/A  Psychiatric Specialty Exam: Physical Exam  Nursing note and vitals reviewed. Constitutional: She is oriented to person, place, and time. She appears well-developed and well-nourished.  Cardiovascular: Normal rate.  Respiratory: Effort normal.  Musculoskeletal: Normal range of motion.  Neurological: She is alert and oriented to person, place, and time.  Skin: Skin is warm.    Review of Systems  Constitutional: Negative.   HENT: Negative.   Eyes: Negative.   Respiratory: Negative.   Cardiovascular: Negative.   Gastrointestinal: Negative.   Genitourinary: Negative.   Musculoskeletal: Negative.   Skin: Negative.   Neurological: Negative.   Endo/Heme/Allergies: Negative.   Psychiatric/Behavioral: Positive  for depression (improving). Negative for hallucinations and suicidal ideas. The patient is nervous/anxious (minimal).     Blood pressure 119/65, pulse 74, temperature 98.3 F (36.8 C), temperature source Oral, resp. rate 18, height 5\' 7"  (1.702 m), weight 91.6 kg (202 lb).Body mass index is 31.64 kg/m.  General Appearance: Casual  Eye Contact:  Good  Speech:  Clear and Coherent and Normal Rate  Volume:  Normal  Mood:  Euthymic  Affect:  Appropriate  Thought Process:  Goal Directed and Descriptions of Associations: Intact  Orientation:  Full (Time, Place, and Person)  Thought Content:  WDL  Suicidal Thoughts:  No  Homicidal Thoughts:  No  Memory:  Immediate;   Good Recent;   Good Remote;   Good  Judgement:  Good  Insight:  Good  Psychomotor Activity:  Normal  Concentration:  Concentration: Good and Attention Span: Good  Recall:  Good  Fund of Knowledge:  Good  Language:  Good  Akathisia:  No  Handed:  Right  AIMS (if indicated):     Assets:  Communication Skills Desire for Improvement Financial Resources/Insurance Housing Physical Health Social Support Transportation  ADL's:  Intact  Cognition:  WNL  Sleep:  Number of Hours: 6.75   Problems Addressed: MDD severe  Treatment Plan Summary: Daily contact with patient to assess and evaluate symptoms and progress in treatment, Medication management and Plan is to:  -Continue Vistaril 15 mg PO Q6H PRN for anxiety -Continue Zoloft 50 mg PO Daily for mood stability -Continue Trazodone 50 mg QHS PRN for insomnia -Encourage group therapy participation  Maryfrances Bunnellravis B Lanika Colgate, FNP 03/03/2017, 1:12 PM

## 2017-03-03 NOTE — Progress Notes (Signed)
Pt attend wrap up group. Her day was a 8. Pt said she has no goal for today. Pt said she enjoyed her visitors. She found out about out patient care once she leave here. Pt said she feels she need more treatment.

## 2017-03-03 NOTE — Progress Notes (Signed)
Patient ID: Dawn Velez, female   DOB: [birth year], 32 y.o.   MRN: [id number]  DAR: Pt. denies SI/HI and A/V Hallucinations. She reports that her sleep last night was good, her appetite is good, her energy level is normal, and her concentration is good. She rates her depression level 4/10, her hopelessness level is 4/10, and her anxiety level is 7/10. Patient does not report any pain or discomfort at this time. Support and encouragement provided to the patient. Scheduled medications administered to patient per physician's orders. Patient is minimal but cooperative. She is seen in the milieu interacting with her peers and is attending groups. Her affect remains flat and mood is depressed. Q15 minute checks are maintained for safety.

## 2017-03-03 NOTE — Plan of Care (Signed)
  Progressing Self-Concept: Level of anxiety will decrease 03/03/2017 1532 - Progressing by Lenord Fellersopson, Leevi Cullars Elizabeth, RN

## 2017-03-03 NOTE — [facility] Group Notes (Signed)
LCSW Group Therapy Note 03/03/2017 12:42 PM  Type of Therapy/Topic: Group Therapy: Balance in Life  Participation Level: Active  Description of Group:  This group will address the concept of balance and how it feels and looks when one is unbalanced. Patients will be encouraged to process areas in their lives that are out of balance and identify reasons for remaining unbalanced. Facilitators will guide patients in utilizing problem-solving interventions to address and correct the stressor making their life unbalanced. Understanding and applying boundaries will be explored and addressed for obtaining and maintaining a balanced life. Patients will be encouraged to explore ways to assertively make their unbalanced needs known to significant others in their lives, using other group members and facilitator for support and feedback.  Therapeutic Goals: 1. Patient will identify two or more emotions or situations they have that consume much of in their lives. 2. Patient will identify signs/triggers that life has become out of balance:  3. Patient will identify two ways to set boundaries in order to achieve balance in their lives:  4. Patient will demonstrate ability to communicate their needs through discussion and/or role plays  Summary of Patient Progress:  Dawn Velez was engaged throughout the group session. She participated and contributed to the group's discussion. Dawn Velez states that balance means being able to manage life. Dawn Velez identified stress as a factor that causes her to become unbalanced. She states that she plans to build a daily routine, build a positive support network, and continue to follow up with outpatient services to maintain balance at discharge.      Therapeutic Modalities:  Cognitive Behavioral Therapy Solution-Focused Therapy Assertiveness Training   Alcario DroughtJolan Jeremiyah Velez LCSWA Clinical Social Worker

## 2017-03-04 MED ORDER — HYDROXYZINE HCL 25 MG PO TABS: 25.0000 mg | ORAL_TABLET | Freq: Four times a day (QID) | ORAL | 0 refills | Status: AC | PRN

## 2017-03-04 MED ORDER — SERTRALINE HCL 50 MG PO TABS: 50.0000 mg | ORAL_TABLET | Freq: Every day | ORAL | 0 refills | Status: DC

## 2017-03-04 MED ORDER — TRAZODONE HCL 50 MG PO TABS: 50.0000 mg | ORAL_TABLET | Freq: Every evening | ORAL | 0 refills | Status: AC | PRN

## 2017-03-04 MED ORDER — ACETAMINOPHEN 500 MG PO TABS: 1000.0000 mg | ORAL_TABLET | Freq: Four times a day (QID) | ORAL | 0 refills | Status: DC | PRN

## 2017-03-04 NOTE — BHH Suicide Risk Assessment (Signed)
[facility]   Principal Problem: MDD (major depressive disorder), severe [facility](HCC) Discharge Diagnoses:  Patient Active Problem List   Diagnosis Date Noted  . 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 MDD (major depressive disorder), severe (HCC) [F32.2] 03/01/2017  . Panic disorder [F41.0] 03/01/2017  . SVD (spontaneous vaginal delivery) [O80] 10/28/2012  . Normal pregnancy, repeat [Z34.80] 10/27/2012    Total Time spent with patient: 45 minutes  Musculoskeletal: Strength & Muscle Tone: within normal limits Gait & Station: normal Patient leans: N/A  Psychiatric Specialty Exam: Review of Systems  Constitutional: Negative.   HENT: Negative.   Eyes: Negative.   Respiratory: Negative.   Cardiovascular: Negative.   Gastrointestinal: Negative.   Genitourinary: Negative.   Musculoskeletal: Negative.   Skin: Negative.   Neurological: Negative.   Endo/Heme/Allergies: Negative.   Psychiatric/Behavioral: Negative for depression, hallucinations, memory loss, substance abuse and suicidal ideas. The patient is not nervous/anxious and does not have insomnia.     Blood pressure (!) 111/58, pulse (!) 110, temperature 98.4 F (36.9 C), temperature source Oral, resp. rate 18, height 5\' 7"  (1.702 m), weight 91.6 kg (202 lb).Body mass index is 31.64 kg/m.  General Appearance: Neatly dressed, pleasant, engaging well and cooperative. Appropriate behavior. Not in any distress. Good relatedness. Not internally stimulated  Eye Contact::  Good  Speech:  Spontaneous, normal prosody. Normal tone and rate.   Volume:  Normal  Mood:  Feels better  Affect:  Mobilizing some positive affect.   Thought Process:  Linear  Orientation:  Full (Time, Place, and Person)  Thought Content:  Future oriented. No delusional theme. No preoccupation with violent thoughts. No negative ruminations. No obsession.  No hallucination in any  modality.   Suicidal Thoughts:  No  Homicidal Thoughts:  No  Memory:  Immediate;   Good Recent;   Good Remote;   Good  Judgement:  Good  Insight:  Good  Psychomotor Activity:  Normal  Concentration:  Good  Recall:  Good  Fund of Knowledge:Good  Language: Good  Akathisia:  Negative  Handed:    AIMS (if indicated):     Assets:  Communication Skills Desire for Improvement Financial Resources/Insurance Housing Resilience Transportation Vocational/Educational  Sleep:  Number of Hours: 6.25  Cognition: WNL  ADL's:  Intact   Clinical Assessment::   32 y.o AAF single, lives wit his mother and two children. Works part time at [employer], first Mining engineeryear college student. Background history of Panic disorder but has not been treated. Presented to the ER via EMS. Had repeated panic attacks while at [employer]. Patient has had multiple deaths in her family. Her father committed suicide when she was 89 years of age. She later lost her step father and her mother's fiancee. A year ago, her kids father was killed.  Routine labs are within normal limits. Toxicology is negative. No substances.   Seen today. Says she is getting better. Not fully prepared for discharge today but says she is okay with it. Her mother is working this weekend. She plans to ask her sister to help her take care of her kids as she does not want to be stressed right after being discharged. Says she feels okay once she takes her medication. She has been tolerating her medication well. No panic attack since she has been here. She is sleeping well with medication. Her energy levels are gradually getting better. She is able to think clearly. She is able  to focus on task. Her thoughts are not crowded or racing. No evidence of mania. No hallucination in any modality. She is not making any delusional statement. No passivity of will/thought. She is fully in touch with reality. No thoughts of suicide. No thoughts of homicide. No infanticidal  thoughts. No violent thoughts. No overwhelming anxiety. No new stressors. No access to weapons  Nursing staff reports that patient has been appropriate on the unit. Patient has been interacting well with peers. No behavioral issues. Patient has not voiced any suicidal thoughts. Patient has not been observed to be internally stimulated. Patient has been adherent with treatment recommendations. Patient has been tolerating their medication well.   Patient was discussed at team. Team members feels that patient is on the right trajectory. Team agrees with plan to discharge patient today.   Demographic Factors:  NA  Loss Factors: Loss of significant relationship  Historical Factors: NA  Risk Reduction Factors:   Responsible for children under 34 years of age, Sense of responsibility to family, Religious beliefs about death, Employed, Living with another person, especially a relative, Positive social support, Positive therapeutic relationship and Positive coping skills or problem solving skills  Continued Clinical Symptoms:  As above   Cognitive Features That Contribute To Risk:  None    Suicide Risk:  Minimal: No identifiable suicidal ideation.  Patient is not having any thoughts of suicide at this time. Modifiable risk factors targeted during this admission includes depression and panic disorder. Demographical and historical risk factors cannot be modified. Patient is now engaging well. Patient is reliable and is future oriented. We have buffered patient's support structures. At this point, patient is at low risk of suicide. Patient is aware of the effects of psychoactive substances on decision making process. Patient has been provided with emergency contacts. Patient acknowledges to use resources provided if unforseen circumstances changes their current risk stratification.    Follow-up Information    BEHAVIORAL HEALTH OUTPATIENT THERAPY [place] Follow up.   Specialty:  Behavioral  Health Why:  Appointment is  Contact information: [address] [place] [address] [id number] mc [place] [address] [phone]          Plan Of Care/Follow-up recommendations:  1. Continue current psychotropic medications 2. Mental health and addiction follow up as arranged.  3. Discharge in care of her family 4. Provided limited quantity of prescriptions   Georgiann Cocker, MD 03/04/2017, 10:13 AM

## 2017-03-04 NOTE — Progress Notes (Signed)
Adult Psychoeducational Group Note  Date:  03/04/2017 Time:  9:52 AM  Group Topic/Focus:  Goals Group:   The focus of this group is to help patients establish daily goals to achieve during treatment and discuss how the patient can incorporate goal setting into their daily lives to aide in recovery.  Participation Level:  Active  Participation Quality:  Appropriate  Affect:  Appropriate  Cognitive:  Alert  Insight: Appropriate  Engagement in Group:  Engaged  Modes of Intervention:  Discussion and Education  Additional Comments:  Pt attended group and was hopeful that she would be going home today. Pt stated " I'm feeling a whole lot better now than when I came to the hospital".  Monzerat Handler E 03/04/2017, 9:52 AM

## 2017-03-04 NOTE — Progress Notes (Signed)
Recreation Therapy Notes  Date: 03/04/17 Time: 0930 Location: 300 [address] Dayroom  Group Topic: Stress Management  Goal Area(s) Addresses:  Patient will verbalize importance of using healthy stress management.  Patient will identify positive emotions associated with healthy stress management.   Intervention: Stress Management  Activity :  Body Scan.  LRT introduced the stress management technique meditation.  LRT played a body scan meditation to allow patients to examine any sensations or tension they may have been feeling.  Patients were to follow along as the meditation played.  Education:  Stress Management, Discharge Planning.   Education Outcome: Acknowledges edcuation/In group clarification offered/Needs additional education  Clinical Observations/Feedback: Pt did not attend group.    Razi Hickle, LRT/CTRS         Gavin Telford A 03/04/2017 10:47 AM 

## 2017-03-04 NOTE — Progress Notes (Signed)
  [facility] :  Will you be returning to the same living situation after discharge:  Yes,  with her mother and family At discharge, do you have transportation home?: Yes,  patient reports her mother will pick her up around 3:00p Do you have the ability to pay for your medications: Yes,  [place]  Release of information consent forms completed and in the chart;  Patient's signature needed at discharge.  Patient to Follow up at: Follow-up Information    BEHAVIORAL HEALTH OUTPATIENT THERAPY [place] Follow up.   Specialty:  Behavioral Health Why:  IOP Appointment is 03/09/2017 at 8:30am. Bring your insurance card and photo ID. Jeri ModenaRita Clark will call you Monday morning to provide additional information.  Contact information: [address] [id number] mc [place] [phone]          Next level of care provider has access to [facility] Link:yes  Safety Planning and Suicide Prevention discussed: Yes,  with the patient's mother, Luci BankDarlene Parker.   Have you used any form of tobacco in the last 30 days? (Cigarettes, Smokeless Tobacco, Cigars, and/or Pipes): No  Has patient been referred to the Quitline?: N/A patient is not a smoker  Patient has been referred for addiction treatment: N/A  Maeola SarahJolan E Alanie Syler, LCSWA 03/04/2017, 10:24 AM

## 2017-03-04 NOTE — Discharge Summary (Signed)
Physician Discharge Summary Note  Patient:  Dawn Velez is an 32 y.o., female MRN:  [id number] DOB:  1985-08-17 Patient phone:  [phone] (home)  Patient address:   [address] [place] [address],  Total Time spent with patient: Greater than 30 minutes  Date of Admission:  02/28/2017 Date of Discharge: 03-04-17  Reason for Admission: Worsening depression manifested as panic attacks.  Principal Problem: MDD (major depressive disorder), severe [facility])  Discharge Diagnoses: Patient Active Problem List   Diagnosis Date Noted  . Severe episode of recurrent major depressive disorder, without psychotic features ([place]) [F33.2]   . MDD (major depressive disorder), severe ([place]) [F32.2] 03/01/2017  . Panic disorder [F41.0] 03/01/2017  . SVD (spontaneous vaginal delivery) [O80] 10/28/2012  . Normal pregnancy, repeat [Z34.80] 10/27/2012   Past Psychiatric History: MDD  Past Medical History:  Past Medical History:  Diagnosis Date  . Headache(784.0)   . No pertinent past medical history   . Normal pregnancy, first 03/29/2011  . Normal pregnancy, repeat 10/27/2012  . SVD (spontaneous vaginal delivery) 03/30/2011  . SVD (spontaneous vaginal delivery) 10/28/2012    Past Surgical History:  Procedure Laterality Date  . I&D EXTREMITY     boil on back  . NO PAST SURGERIES     Family History:  Family History  Problem Relation Age of Onset  . Hypertension Mother   . Hypertension Sister   . Mental retardation Brother   . Mental illness Brother        schizophrenia  . Heart disease Paternal Grandmother   . Drug abuse Paternal Grandfather   . Hypertension Paternal Grandfather   . Alcohol abuse Paternal Uncle   . Anesthesia problems Neg Hx    Family Psychiatric  History: See H&P Social History:  Social History   Substance and Sexual Activity  Alcohol Use No     Social History   Substance and Sexual Activity  Drug Use No    Social History   Socioeconomic  History  . Marital status: Single    Spouse name: None  . Number of children: None  . Years of education: None  . Highest education level: None  Social Needs  . Financial resource strain: None  . Food insecurity - worry: None  . Food insecurity - inability: None  . Transportation needs - medical: None  . Transportation needs - non-medical: None  Occupational History  . None  Tobacco Use  . Smoking status: Former Smoker    Packs/day: 1.00    Years: 1.00    Pack years: 1.00    Types: Cigarettes    Last attempt to quit: 03/30/2010    Years since quitting: 6.9  . Smokeless tobacco: Never Used  Substance and Sexual Activity  . Alcohol use: No  . Drug use: No  . Sexual activity: Yes    Birth control/protection: None  Other Topics Concern  . None  Social History Narrative  . None   Hospital Course: (Per Md's admission SRA): 32 y.o AAF single, lives with his mother and two children. Works part time at [employer], first Occupational psychologist. Background history of Panic disorder but has not been treated. Presented to the ER via EMS. Had repeated panic attacks while at [employer]. Patient has had multiple deaths in her family. Her father committed suicide when she was 55 years of age. She later lost her step father and her mother's fiancee. A year ago, her kids father was killed.  Routine labs are within  normal limits. Toxicology is negative. No substances. Patient reports long history of depression. Says she and her children has been in counseling since her kids father passed last 30-Jun-2022. He died from motorcycle accident. Patient says she been feeling very depressed.worse in the past couple of weeks. No motivation to do things. She is withdrawn and not able to enjoy activities with her children. She struggles to keep up with task at work and at home. She tends to binge as a way of coping. Says she has deliberately lost over 20 pounds.Says getting into sleep has been an issue.  Dawn Velez was  admitted to the [facility] adult unit with complaints of worsening symptoms of depression & panic attacks. She cited a lot of familial related stressors as the trigger. She was in need of mood stabilization treatments. After evaluation of her presenting symptoms, the medication regimen targeting those presenting symptoms were initiated with her consent.   Dawn Velez was medicated & discharged on; Hydroxyzine 25 mg prn for anxiety, Sertraline 50 mg for depression & Trazodone 50 mg for insomnia. She was enrolled & participated in the group counseling sessions being offered & held on this unit. She was counseled & learned coping skills that should help her cope better & maintain mood stability after discharge. She presented no other previously existing medical issues that required treatment. She tolerated her treatment regimen without any adverse effects reported.   While her treatment was on going, Meryn's improvement was monitored by observation & her daily reports of symptom reduction noted.  Her emotional & mental status were monitored by daily self-inventory reports completed by her & the clinical staff. She was evaluated daily by the treatment team for mood stability & the need for continued recovery after discharge. She was offered further treatment options upon discharge & will follow up with the outpatient psychiatric services as listed below. She was provided with all the necessary information needed to make this appointment without problems.    Upon discharge, Dawn Velez was both mentally & medically stable. She is currently denying suicidal, homicidal ideation, auditory, visual/tactile hallucinations, delusional thoughts & or paranoia. She was provided with a 7 days worth, supply samples of her [facility] discharge medications. She left [facility] with all personal belongings in no apparent distress. Transportation mother.      Physical Findings: AIMS: Facial and Oral Movements Muscles of Facial Expression: None,  normal Lips and Perioral Area: None, normal Jaw: None, normal Tongue: None, normal,Extremity Movements Upper (arms, wrists, hands, fingers): None, normal Lower (legs, knees, ankles, toes): None, normal, Trunk Movements Neck, shoulders, hips: None, normal, Overall Severity Severity of abnormal movements (highest score from questions above): None, normal Incapacitation due to abnormal movements: None, normal Patient's awareness of abnormal movements (rate only patient's report): No Awareness, Dental Status Current problems with teeth and/or dentures?: No Does patient usually wear dentures?: No  CIWA:  CIWA-Ar Total: 1 COWS:  COWS Total Score: 2  Musculoskeletal: Strength & Muscle Tone: within normal limits Gait & Station: normal Patient leans: N/A  Psychiatric Specialty Exam: Physical Exam  Constitutional: She appears well-developed.  HENT:  Head: Normocephalic.  Eyes: Pupils are equal, round, and reactive to light.  Neck: Normal range of motion.  Cardiovascular: Normal rate.  Respiratory: Effort normal.  GI: Soft.  Genitourinary:  Genitourinary Comments: Deferred  Musculoskeletal: Normal range of motion.  Neurological: She is alert.  Skin: Skin is warm.    Review of Systems  Constitutional: Negative.   HENT: Negative.   Eyes: Negative.  Respiratory: Negative.   Cardiovascular: Negative.   Gastrointestinal: Negative.   Genitourinary: Negative.   Musculoskeletal: Negative.   Skin: Negative.   Neurological: Negative.   Endo/Heme/Allergies: Negative.   Psychiatric/Behavioral: Positive for depression (Stable). Negative for hallucinations, memory loss, substance abuse and suicidal ideas. The patient has insomnia (Stable). The patient is not nervous/anxious.     Blood pressure 109/65, pulse 84, temperature 98.4 F (36.9 C), temperature source Oral, resp. rate 18, height '5\' 7"'$  (1.702 m), weight 91.6 kg (202 lb).Body mass index is 31.64 kg/m.  See Md's SRA   Have you  used any form of tobacco in the last 30 days? (Cigarettes, Smokeless Tobacco, Cigars, and/or Pipes): No  Has this patient used any form of tobacco in the last 30 days? (Cigarettes, Smokeless Tobacco, Cigars, and/or Pipes): No  Blood Alcohol level:  Lab Results  Component Value Date   ETH <10 21/19/4174   Metabolic Disorder Labs:  No results found for: HGBA1C, MPG No results found for: PROLACTIN No results found for: CHOL, TRIG, HDL, CHOLHDL, VLDL, LDLCALC  See Psychiatric Specialty Exam and Suicide Risk Assessment completed by Attending Physician prior to discharge.  Discharge destination:  Home  Is patient on multiple antipsychotic therapies at discharge:  No   Has Patient had three or more failed trials of antipsychotic monotherapy by history:  No  Recommended Plan for Multiple Antipsychotic Therapies: NA  Allergies as of 03/04/2017   No Known Allergies     Medication List    STOP taking these medications   ibuprofen 600 MG tablet Commonly known as:  ADVIL,MOTRIN   ibuprofen 800 MG tablet Commonly known as:  ADVIL,MOTRIN   measles, mumps and rubella vaccine injection Commonly known as:  MMR   methocarbamol 500 MG tablet Commonly known as:  ROBAXIN   oxyCODONE-acetaminophen 10-325 MG tablet Commonly known as:  PERCOCET   predniSONE 20 MG tablet Commonly known as:  DELTASONE   PRENATAL COMPLETE 14-0.4 MG Tabs     TAKE these medications     Indication  acetaminophen 500 MG tablet Commonly known as:  TYLENOL Take 2 tablets (1,000 mg total) by mouth every 6 (six) hours as needed. Head aches  Indication:  Fever, Pain   hydrOXYzine 25 MG tablet Commonly known as:  ATARAX/VISTARIL Take 1 tablet (25 mg total) by mouth every 6 (six) hours as needed for anxiety.  Indication:  Feeling Anxious   sertraline 50 MG tablet Commonly known as:  ZOLOFT Take 1 tablet (50 mg total) by mouth daily. For depression Start taking on:  03/05/2017  Indication:  Major Depressive  Disorder   traZODone 50 MG tablet Commonly known as:  DESYREL Take 1 tablet (50 mg total) by mouth at bedtime as needed for sleep.  Indication:  Trouble Sleeping      Follow-up [place] Follow up.   Specialty:  Behavioral Health Why:  IOP Appointment is 03/09/2017 at 8:30am. Bring your insurance card and photo ID. Dellia Nims will call you Monday morning to provide additional information.  Contact information: [place] [id number] [place] [phone]         Follow-up recommendations: Activity:  As tolerated Diet: As recommended by your primary care doctor. Keep all scheduled follow-up appointments as recommended.  Comments: Patient is instructed prior to discharge to: Take all medications as prescribed by his/her mental healthcare provider. Report any adverse effects and or reactions from the medicines to his/her  outpatient provider promptly. Patient has been instructed & cautioned: To not engage in alcohol and or illegal drug use while on prescription medicines. In the event of worsening symptoms, patient is instructed to call the crisis hotline, 911 and or go to the nearest ED for appropriate evaluation and treatment of symptoms. To follow-up with his/her primary care provider for your other medical issues, concerns and or health care needs.   Signed: Lindell Spar, NP, PMHNP, FNP-BC 03/04/2017, 11:45 AM

## 2017-03-04 NOTE — Progress Notes (Signed)
Patient verbalizes readiness for discharge. Follow up plan explained by Meriam SpragueBeverly, RN. AVS, transition record and SRA given along with prescriptions. Sample meds provided. All belongings returned. Suicide Safety Plan completed, on chart and copy given to patient. Patient verbalizes understanding. Denies SI/HI and assures this Clinical research associatewriter she will seek assistance should that change. Patient discharged ambulatory and in stable condition to family.

## 2017-03-04 NOTE — Progress Notes (Signed)
D:  Patient's self inventory, patient sleeps good, sleep medication helpful.  Fair appetite.  Rated depression 4, hopeless 3-4, hopeless 7.  Denied withdrawals.  Denied SI.  Denied physical problems.  Goal is discharge.  Plans to focus today.  No discharge plans. A:  Medications administered per MD orders.  Emotional support and encouragement given patient. R:  Patient denied SI and HI, contracts for safety.  Denied A/V hallucinations.  Safety maintained with 15 minute checks.

## 2017-03-04 NOTE — BHH Group Notes (Signed)
  [facility]  Date/Time: 03/04/17, 1315  Type of Therapy/Topic:  Group Therapy:  Emotion Regulation  Participation Level:  Active   Mood:pleasant  Description of Group:    The purpose of this group is to assist patients in learning to regulate negative emotions and experience positive emotions. Patients will be guided to discuss ways in which they have been vulnerable to their negative emotions. These vulnerabilities will be juxtaposed with experiences of positive emotions or situations, and patients challenged to use positive emotions to combat negative ones. Special emphasis will be placed on coping with negative emotions in conflict situations, and patients will process healthy conflict resolution skills.  Therapeutic Goals: 1. Patient will identify two positive emotions or experiences to reflect on in order to balance out negative emotions:  2. Patient will label two or more emotions that they find the most difficult to experience:  3. Patient will be able to demonstrate positive conflict resolution skills through discussion or role plays:   Summary of Patient Progress: Pt shared that regret is one emotion that pt finds difficult to experience.  Pt was active in discussion regarding positive ways to deal with difficult emotions.  Pt shared about how her use of substances has come from attempts to manage negative emotions in the past.       Therapeutic Modalities:   Cognitive Behavioral Therapy Feelings Identification Dialectical Behavioral Therapy  Daleen SquibbGreg Renda Pohlman, LCSW

## 2018-01-31 ENCOUNTER — Encounter (HOSPITAL_COMMUNITY): Payer: Self-pay | Admitting: Emergency Medicine

## 2018-01-31 ENCOUNTER — Other Ambulatory Visit: Payer: Self-pay

## 2018-01-31 ENCOUNTER — Emergency Department (HOSPITAL_COMMUNITY)
Admission: EM | Admit: 2018-01-31 | Discharge: 2018-02-01 | Disposition: A | Discharge status: Home / Self Care | Payer: Self-pay | Attending: Emergency Medicine | Admitting: Emergency Medicine

## 2018-01-31 DIAGNOSIS — Z87891 Personal history of nicotine dependence: Secondary | ICD-10-CM | POA: Insufficient documentation

## 2018-01-31 DIAGNOSIS — M7918 Myalgia, other site: Secondary | ICD-10-CM

## 2018-01-31 DIAGNOSIS — Z79899 Other long term (current) drug therapy: Secondary | ICD-10-CM | POA: Insufficient documentation
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
## 2018-02-01 ENCOUNTER — Emergency Department (HOSPITAL_COMMUNITY): Payer: Self-pay

## 2018-02-01 MED ORDER — METHOCARBAMOL 500 MG PO TABS
1000.0000 mg | ORAL_TABLET | Freq: Once | ORAL | Status: AC
  Administered 2018-02-01: 1000 mg via ORAL
  Filled 2018-02-01: qty 2

## 2018-02-01 MED ORDER — KETOROLAC TROMETHAMINE 15 MG/ML IJ SOLN
15.0000 mg | Freq: Once | INTRAMUSCULAR | Status: AC
  Administered 2018-02-01: 15 mg via INTRAMUSCULAR
  Filled 2018-02-01: qty 1

## 2018-02-01 MED ORDER — NAPROXEN 500 MG PO TABS: 500.0000 mg | ORAL_TABLET | Freq: Two times a day (BID) | ORAL | 0 refills | Status: AC

## 2018-02-01 MED ORDER — METHOCARBAMOL 500 MG PO TABS: 1000.0000 mg | ORAL_TABLET | Freq: Four times a day (QID) | ORAL | 0 refills | Status: AC

## 2018-02-01 NOTE — ED Notes (Signed)
Patient transported to X-ray 
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
